# Patient Record
Sex: Male | Born: 1965 | Hispanic: Yes | Smoking: Never smoker
Health system: Southern US, Community
[De-identification: ages and names within clinical notes are randomized; demographics above are authoritative.]

---

## 2014-07-16 ENCOUNTER — Ambulatory Visit (INDEPENDENT_AMBULATORY_CARE_PROVIDER_SITE_OTHER): Payer: Self-pay | Admitting: Family Medicine

## 2014-07-16 VITALS — BP 110/88 | HR 64 | Temp 98.0°F | Resp 16 | Ht 64.0 in | Wt 165.0 lb

## 2014-07-16 DIAGNOSIS — R42 Dizziness and giddiness: Secondary | ICD-10-CM

## 2014-07-16 DIAGNOSIS — R51 Headache: Secondary | ICD-10-CM

## 2014-07-16 DIAGNOSIS — R519 Headache, unspecified: Secondary | ICD-10-CM

## 2014-07-16 DIAGNOSIS — H538 Other visual disturbances: Secondary | ICD-10-CM

## 2014-07-16 LAB — GLUCOSE, POCT (MANUAL RESULT ENTRY): POC Glucose: 82 mg/dl (ref 70–99)

## 2014-07-16 MED ORDER — MECLIZINE HCL 25 MG PO TABS: 25.0000 mg | ORAL_TABLET | Freq: Three times a day (TID) | ORAL | Status: DC | PRN
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
## 2014-07-16 NOTE — Patient Instructions (Signed)
Si el remedio no funciona despues de does diag, favor de regressar y vomos a hacer in CAT scan    Vrtigo (Vertigo)  Vrtigo es la sensacin de que se est moviendo estando quieto. Puede ser peligroso si ocurre cuando est trabajado, conduciendo vehculos o realizando actividades difciles.  CAUSAS  El vrtigo se produce cuando hay un conflicto en las seales que se envan al cerebro desde los sistemas visual y sensorial del cuerpo. Hay numerosas causas que Dole Foodoriginan este problema, entre las que se incluyen:   Infecciones, especialmente en el odo interno.  Nelia ShiUna mala reaccin a un medicamento o mal uso de alcohol y frmacos.  Abstinencia de drogas o alcohol.  Cambios rpidos de posicin, como al [employer], Incacostarse o darse vuelta en la cama.  Dolor de Surveyor, mineralscabeza migraoso.  Disminucin del flujo sanguneo hacia el cerebro.  Aumento de la presin en el cerebro por un traumatismo, infeccin, tumor o sangrado en la cabeza. SNTOMAS  Puede sentir como si el mundo da vueltas o va a caer al piso. Como hay problemas en el equilibrio, el vrtigo puede causar nuseas y vmitos. Tiene movimientos oculares involuntarios (nistagmus).  DIAGNSTICO  El vrtigo normalmente se diagnostica con un examen fsico. Si la causa no se conoce, el mdico puede indicar diagnstico por imgenes, como una resonancia magntica (imgenes por Health visitorresonancia magntica).  TRATAMIENTO  La mayor parte de los casos de vrtigo se resuelve sin TEFL teachertratamiento. Segn la causa, el mdico podr recetar ciertos medicamentos. Si se relaciona con la posicin del cuerpo, podr recomendarle movimientos o procedimientos para corregir el problema. En algunos casos raros, si la causa del vrtigo es un problema en el odo interno, necesitar [place] ciruga.  INSTRUCCIONES PARA EL CUIDADO DOMICILIARIO  Siga las indicaciones del mdico.  Evite conducir vehculos.  Evite operar maquinarias pesadas.  Evite realizar tareas que seran peligrosas para usted u  otras personas durante un episodio de vrtigo.  Comunquele al mdico si nota que ciertos medicamentos parecen asociarse con las crisis. Algunos medicamentos que se usan para tratar los episodios, en Guardian Life Insurancealgunas personas los empeoran. SOLICITE ATENCIN MDICA DE INMEDIATO SI:  Los medicamentos no [place]ivian las crisis o hacen que estas empeoren.  Tiene dificultad para hablar, caminar, siente debilidad o tiene problemas para Boeingusar los brazos, las manos o las piernas.  Comienza a sufrir un dolor de cabeza intenso.  Las nuseas y los vmitos no se Samoaalivian o se Press photographeragravan.  Aparecen trastornos visuales.  Un miembro de su familia nota cambios en su conducta.  Hay alguna modificacin en su trastorno que parece Holiday representativehacerlo empeorar en lugar de Scientist, clinical (histocompatibility and immunogenetics)mejorar. ASEGRESE DE QUE:   Comprende estas instrucciones.  Controlar su enfermedad.  Solicitar ayuda de inmediato si no mejora o si empeora. Document Released: 01/28/2005 Document Revised: 07/13/2011 [facility] Patient Information 2015 [place], [place]. This information is not intended to replace advice given to you by your health care provider. Make sure you discuss any questions you have with your health care provider.

## 2014-09-06 ENCOUNTER — Ambulatory Visit (INDEPENDENT_AMBULATORY_CARE_PROVIDER_SITE_OTHER): Payer: Self-pay | Admitting: Emergency Medicine

## 2014-09-06 VITALS — BP 112/70 | HR 59 | Temp 98.0°F | Resp 18 | Ht 64.0 in | Wt 163.0 lb

## 2014-09-06 DIAGNOSIS — K4090 Unilateral inguinal hernia, without obstruction or gangrene, not specified as recurrent: Secondary | ICD-10-CM

## 2014-09-06 NOTE — Patient Instructions (Signed)
Hernia (Hernia) Una hernia ocurre cuando un rgano interno protruye a travs de un punto dbil de los msculos de la pared muscular abdominal (del vientre). Se producen con mayor frecuencia en la ingle y alrededor del ombligo. Generalmente puede volver a colocarse en su lugar (reducirse). La mayor parte de las hernias tienden a empeorar con el tiempo. Algunas hernias abdominales pueden atascarse en la abertura (hernias irreductibles o hernia encarcelada) y no pueden reducirse. Una hernia abdominal irreducible que est ligeramente apretada en la abertura, corre el riesgo de daar el flujo de sangre (hernia estrangulada). Una hernia estrangulada es una emergencia mdica. Debido al riesgo que se corre en caso de hernia irreducible o estrangulada, se recomienda la ciruga para repararla. CAUSAS  Levantar peso excesivo.  Mucha tos.  Tensin al ir de cuerpo.  Durante la ciruga abdominal se realiza un corte (incisin). INSTRUCCIONES PARA EL CUIDADO DOMICILIARIO  No es necesario hacer reposo en cama. Puede continuar con sus actividades habituales.  Evite levantar peso (ms de 10 libras o 4,5 Kg) o hacer esfuerzos.  Tos con suavidad. Si actualmente usted fuma, es el momento de abandonar el hbito. Hasta el procedimiento quirrgico ms perfecto puede malograrse si se hace fuerza contnua para toser. Aunque su hernia no haya sido reparada, la tos puede agravar el problema.  No use nada apretado sobre la hernia. No trate de mantenerla adentro con un vendaje externo o un braguero. Puede lesionar el contenido abdominal si los aprieta dentro del saco de la hernia.  Consumir una dieta normal.  Evite la constipacin. Si hace mucha fuerza aumentar el tamao de la hernia y podr daarse la reparacin. Si no lo logra slo con la dieta, puede usar laxantes. SOLICITE ATENCIN MDICA DE INMEDIATO SI:  Tiene fiebre.  Presenta un dolor abdominal cada vez ms intenso.  Si tiene malestar estomacal (nuseas) y  vmitos.  La hernia se ha atascado fuera del abdomen, se ve descolorida, se siente dura o le duele.  Observa cambios en el hbito intestinal o en la hernia, lo que no es habitual en usted.  El dolor o la hinchazn alrededor de la hernia aumentan.  No puede volver a colocar la hernia en su lugar ejerciendo una presin suave mientras se encuentra recostado. EST SEGURO QUE:   Comprende las instrucciones para el alta mdica.  Controlar su enfermedad.  Solicitar atencin mdica de inmediato segn las indicaciones. Document Released: 04/20/2005 Document Revised: 07/13/2011 ExitCare Patient Information 2015 ExitCare, LLC. This information is not intended to replace advice given to you by your health care provider. Make sure you discuss any questions you have with your health care provider.  

## 2014-09-06 NOTE — Progress Notes (Signed)
[facility] [address], [place] [id number] [phone]  Date:  09/06/2014   Name:  Darren Day   DOB:  1965/05/19   MRN:  [id number]  PCP:  No PCP Per Patient    Chief Complaint: No chief complaint on file.   History of Present Illness:  Darren Day is a 49 y.o. very pleasant male patient who presents with the following:  Mass over right groin over past month Works in Aeronautical engineerlandscaping. No history of overuse or injury No improvement with over the counter medications or other home remedies.  Denies other complaint or health concern today.   There are no active problems to display for this patient.   No past medical history on file.  No past surgical history on file.  History  Substance Use Topics  . Smoking status: Never Smoker   . Smokeless tobacco: Not on file  . Alcohol Use: No    No family history on file.  No Known Allergies  Medication list has been reviewed and updated.  Current Outpatient Prescriptions on File Prior to Visit  Medication Sig Dispense Refill  . meclizine (ANTIVERT) 25 MG tablet Take 1 tablet (25 mg total) by mouth 3 (three) times daily as needed for dizziness. 30 tablet 0   No current facility-administered medications on file prior to visit.    Review of Systems:  Review of Systems  Constitutional: Negative for fever, chills and fatigue.  HENT: Negative for congestion, ear pain, hearing loss, postnasal drip, rhinorrhea and sinus pressure.   Eyes: Negative for discharge and redness.  Respiratory: Negative for cough, shortness of breath and wheezing.   Cardiovascular: Negative for chest pain and leg swelling.  Gastrointestinal: Negative for nausea, vomiting, abdominal pain, constipation and blood in stool.  Genitourinary: Negative for dysuria, urgency and frequency.  Musculoskeletal: Negative for neck stiffness.  Skin: Negative for rash.  Neurological: Negative for seizures, weakness and headaches.   Physical  Examination: Filed Vitals:   09/06/14 1520  BP: 112/70  Pulse: 59  Temp: 98 F (36.7 C)  Resp: 18   Filed Vitals:   09/06/14 1520  Height: 5\' 4"  (1.626 m)  Weight: 163 lb (73.936 kg)   Body mass index is 27.97 kg/(m^2). Ideal Body Weight: Weight in (lb) to have BMI = 25: 145.3   GEN: WDWN, NAD, Non-toxic, Alert & Oriented x 3 HEENT: Atraumatic, Normocephalic.  Ears and Nose: No external deformity. EXTR: No clubbing/cyanosis/edema NEURO: Normal gait.  PSYCH: Normally interactive. Conversant. Not depressed or anxious appearing.  Calm demeanor.  Right inguinal hernia easily reducible   Assessment and Plan: Right inguinal hernia surgery  Signed Phillips OdorJeffery Emali Heyward, MD

## 2015-05-05 DIAGNOSIS — K4090 Unilateral inguinal hernia, without obstruction or gangrene, not specified as recurrent: Secondary | ICD-10-CM | POA: Insufficient documentation

## 2016-07-31 ENCOUNTER — Emergency Department (HOSPITAL_COMMUNITY)
Admission: EM | Admit: 2016-07-31 | Discharge: 2016-07-31 | Disposition: A | Discharge status: Home / Self Care | Payer: Self-pay | Attending: Emergency Medicine | Admitting: Emergency Medicine

## 2016-07-31 ENCOUNTER — Encounter (HOSPITAL_COMMUNITY): Payer: Self-pay | Admitting: Emergency Medicine

## 2016-07-31 DIAGNOSIS — B029 Zoster without complications: Secondary | ICD-10-CM | POA: Insufficient documentation

## 2016-07-31 LAB — COMPREHENSIVE METABOLIC PANEL
ALT: 56 U/L (ref 17–63)
ANION GAP: 7 (ref 5–15)
AST: 32 U/L (ref 15–41)
Albumin: 4.3 g/dL (ref 3.5–5.0)
Alkaline Phosphatase: 79 U/L (ref 38–126)
BILIRUBIN TOTAL: 1.1 mg/dL (ref 0.3–1.2)
BUN: 14 mg/dL (ref 6–20)
CO2: 27 mmol/L (ref 22–32)
Calcium: 9.8 mg/dL (ref 8.9–10.3)
Chloride: 104 mmol/L (ref 101–111)
Creatinine, Ser: 0.76 mg/dL (ref 0.61–1.24)
GFR calc non Af Amer: >60 mL/min (ref >60)
Glucose, Bld: 141 mg/dL — ABNORMAL HIGH (ref 65–99)
Potassium: 3.5 mmol/L (ref 3.5–5.1)
SODIUM: 138 mmol/L (ref 135–145)
Total Protein: 7.2 g/dL (ref 6.5–8.1)

## 2016-07-31 LAB — CBC WITH DIFFERENTIAL/PLATELET
BASOS ABS: 0.1 10*3/uL (ref 0.0–0.1)
Basophils Relative: 1 %
Eosinophils Absolute: 0.4 10*3/uL (ref 0.0–0.7)
Eosinophils Relative: 6 %
HCT: 44 % (ref 39.0–52.0)
Hemoglobin: 15.1 g/dL (ref 13.0–17.0)
Lymphocytes Relative: 29 %
Lymphs Abs: 1.7 10*3/uL (ref 0.7–4.0)
MCH: 31.5 pg (ref 26.0–34.0)
MCHC: 34.3 g/dL (ref 30.0–36.0)
MCV: 91.7 fL (ref 78.0–100.0)
Monocytes Absolute: 0.5 10*3/uL (ref 0.1–1.0)
Monocytes Relative: 9 %
NEUTROS ABS: 3.3 10*3/uL (ref 1.7–7.7)
NEUTROS PCT: 55 %
PLATELETS: 200 10*3/uL (ref 150–400)
RBC: 4.8 MIL/uL (ref 4.22–5.81)
RDW: 13.4 % (ref 11.5–15.5)
WBC: 5.9 10*3/uL (ref 4.0–10.5)

## 2016-07-31 MED ORDER — NAPROXEN 500 MG PO TABS: 500.0000 mg | ORAL_TABLET | Freq: Two times a day (BID) | ORAL | 0 refills | Status: DC

## 2016-07-31 MED ORDER — HYDROCODONE-ACETAMINOPHEN 5-325 MG PO TABS: 1.0000 | ORAL_TABLET | Freq: Four times a day (QID) | ORAL | 0 refills | Status: DC | PRN

## 2016-07-31 MED ORDER — ACYCLOVIR 400 MG PO TABS: 800.0000 mg | ORAL_TABLET | Freq: Every day | ORAL | 0 refills | Status: AC

## 2016-07-31 NOTE — Discharge Instructions (Signed)
Take medications as prescribed.  Return without fail for worsening symptoms, including fever, worsening rash, rash involving the tip of the nose/forehead/eyes, or any other symptoms concerning to you.

## 2016-07-31 NOTE — ED Provider Notes (Signed)
[facility]EMERGENCY DEPT Provider Note   CSN: [id number] Arrival date & time: 07/31/16  2111     History   Chief Complaint Chief Complaint  Patient presents with  . Herpes Zoster    HPI Darren Day is a 51 y.o. male.  HPI 51 year old male who presents with rash over face. History obtained via spanish interpreter. Onset of the left sided headache about 8 days ago with tingling over the left face. Four days ago developed blistering rash over the left side of the face and the left neck. Painful to touch. No fever or chills. With mild sore throat. No vision changes or severe eye pain. No pus drainage from eye. No ringing in the ears or hearing loss.   History reviewed. No pertinent past medical history.  There are no active problems to display for this patient.   History reviewed. No pertinent surgical history.     Home Medications    Prior to Admission medications   Medication Sig Start Date End Date Taking? Authorizing Provider  meclizine (ANTIVERT) 25 MG tablet Take 1 tablet (25 mg total) by mouth 3 (three) times daily as needed for dizziness. 07/16/14   Elvina Sidle, MD    Family History No family history on file.  Social History Social History  Substance Use Topics  . Smoking status: Never Smoker  . Smokeless tobacco: Never Used  . Alcohol use No     Allergies   Patient has no known allergies.   Review of Systems Review of Systems 10/14 systems reviewed and are negative other than those stated in the HPI   Physical Exam Updated Vital Signs BP (!) 125/91 (BP Location: Right Arm)   Pulse 67   Temp 98.4 F (36.9 C) (Oral)   Resp 16   SpO2 100%   Physical Exam Physical Exam  Nursing note and vitals reviewed. Constitutional: Well developed, well nourished, non-toxic, and in no acute distress Head: Normocephalic and atraumatic.  Mouth/Throat: Oropharynx is clear and moist.  Neck: Normal range of motion. Neck supple. Ears: normal bilateral  TMs Eye: PERRL, no drainage, EOMI, no conjunctival injection.  Cardiovascular: Normal rate and regular rhythm.   Pulmonary/Chest: Effort normal and breath sounds normal.  Abdominal: Soft. There is no tenderness. There is no rebound and no guarding.  Musculoskeletal: Normal range of motion.  Neurological: Alert, no facial droop, fluent speech, moves all extremities symmetrically Skin: Skin is warm and dry.  there is vesicular rash overlying the V3 distribution of the left face and over the left side of the neck.  Psychiatric: Cooperative   ED Treatments / Results  Labs (all labs ordered are listed, but only abnormal results are displayed) Labs Reviewed  COMPREHENSIVE METABOLIC PANEL - Abnormal; Notable for the following:       Result Value   Glucose, Bld 141 (*)    All other components within normal limits  CBC WITH DIFFERENTIAL/PLATELET    EKG  EKG Interpretation None       Radiology No results found.  Procedures Procedures (including critical care time)  Medications Ordered in ED Medications - No data to display   Initial Impression / Assessment and Plan / ED Course  I have reviewed the triage vital signs and the nursing notes.  Pertinent labs & imaging results that were available during my care of the patient were reviewed by me and considered in my medical decision making (see chart for details).     Presentation consistent with herpes zoster, involving the  V3 distribution of the face and the left side of the neck. No V1 distribution involvement/ocular involvement. Will prescribe acyclovir and pain control. Strict return and follow-up instructions reviewed. He expressed understanding of all discharge instructions and felt comfortable with the plan of care.   Final Clinical Impressions(s) / ED Diagnoses   Final diagnoses:  Herpes zoster without complication    New Prescriptions New Prescriptions   No medications on file     Lavera Guise, MD 07/31/16  2313

## 2016-07-31 NOTE — ED Triage Notes (Signed)
Pt presents with L sided face/neck/head and shoulder pain and tingling/numbness x 4-5 days. Fluid-filled rash noted to L clavicle, L side of face, and posterior L ear. Pt denies fevers, no new washes/soaps/detergent. Pain increased with eating. Neuro intact. Denies dizziness. A&O x 4.

## 2017-11-28 ENCOUNTER — Other Ambulatory Visit: Payer: Self-pay

## 2017-11-28 ENCOUNTER — Emergency Department (HOSPITAL_COMMUNITY)
Admission: EM | Admit: 2017-11-28 | Discharge: 2017-11-28 | Disposition: A | Discharge status: Home / Self Care | Payer: Self-pay | Attending: Emergency Medicine | Admitting: Emergency Medicine

## 2017-11-28 ENCOUNTER — Encounter (HOSPITAL_COMMUNITY): Payer: Self-pay | Admitting: Emergency Medicine

## 2017-11-28 ENCOUNTER — Emergency Department (HOSPITAL_COMMUNITY): Payer: Self-pay

## 2017-11-28 DIAGNOSIS — F1010 Alcohol abuse, uncomplicated: Secondary | ICD-10-CM | POA: Insufficient documentation

## 2017-11-28 DIAGNOSIS — R0789 Other chest pain: Secondary | ICD-10-CM | POA: Insufficient documentation

## 2017-11-28 LAB — ETHANOL: Alcohol, Ethyl (B): 297 mg/dL — ABNORMAL HIGH (ref <10)

## 2017-11-28 LAB — BASIC METABOLIC PANEL
ANION GAP: 11 (ref 5–15)
BUN: 7 mg/dL (ref 6–20)
CALCIUM: 9.6 mg/dL (ref 8.9–10.3)
CO2: 27 mmol/L (ref 22–32)
CREATININE: 0.6 mg/dL — AB (ref 0.61–1.24)
Chloride: 103 mmol/L (ref 98–111)
Glucose, Bld: 98 mg/dL (ref 70–99)
Potassium: 3.6 mmol/L (ref 3.5–5.1)
Sodium: 141 mmol/L (ref 135–145)

## 2017-11-28 LAB — CBC
HCT: 45.8 % (ref 39.0–52.0)
HEMOGLOBIN: 15 g/dL (ref 13.0–17.0)
MCH: 31.4 pg (ref 26.0–34.0)
MCHC: 32.8 g/dL (ref 30.0–36.0)
MCV: 96 fL (ref 78.0–100.0)
PLATELETS: 211 10*3/uL (ref 150–400)
RBC: 4.77 MIL/uL (ref 4.22–5.81)
RDW: 12.9 % (ref 11.5–15.5)
WBC: 5.6 10*3/uL (ref 4.0–10.5)

## 2017-11-28 LAB — I-STAT TROPONIN, ED
TROPONIN I, POC: 0 ng/mL (ref 0.00–0.08)
TROPONIN I, POC: 0 ng/mL (ref 0.00–0.08)

## 2017-11-28 MED ORDER — FAMOTIDINE IN NACL 20-0.9 MG/50ML-% IV SOLN
20.0000 mg | Freq: Once | INTRAVENOUS | Status: AC
  Administered 2017-11-28: 20 mg via INTRAVENOUS
  Filled 2017-11-28: qty 50

## 2017-11-28 MED ORDER — SODIUM CHLORIDE 0.9 % IV BOLUS
1000.0000 mL | Freq: Once | INTRAVENOUS | Status: AC
  Administered 2017-11-28: 1000 mL via INTRAVENOUS

## 2017-11-28 NOTE — ED Provider Notes (Signed)
[facility] EMERGENCY DEPARTMENT Provider Note   CSN: [id number] Arrival date & time: 11/28/17  1605     History   Chief Complaint Chief Complaint  Patient presents with  . Chest Pain    HPI Muhammed Yordan Martindale is a 52 y.o. male.  Patient is a 52 year old male with a history of prior alcohol use who presents with epigastric discomfort.  His family is here with him who states that he has been complaining of pain in his throat throughout the day.  He has had a couple episodes of vomiting.  He has been drinking alcohol throughout today.  He has not had any hematemesis.  He complains of burning discomfort in his throat and acid reflux.  His family has stated that he has had some shortness of breath earlier but he is currently denies any shortness of breath.     History reviewed. No pertinent past medical history.  There are no active problems to display for this patient.   History reviewed. No pertinent surgical history.      Home Medications    Prior to Admission medications   Medication Sig Start Date End Date Taking? Authorizing Provider  HYDROcodone-acetaminophen (NORCO/VICODIN) 5-325 MG tablet Take 1-2 tablets by mouth every 6 (six) hours as needed. Patient not taking: Reported on 11/28/2017 07/31/16   Lavera Guise, MD  meclizine (ANTIVERT) 25 MG tablet Take 1 tablet (25 mg total) by mouth 3 (three) times daily as needed for dizziness. Patient not taking: Reported on 11/28/2017 07/16/14   Elvina Sidle, MD  naproxen (NAPROSYN) 500 MG tablet Take 1 tablet (500 mg total) by mouth 2 (two) times daily with a meal. Patient not taking: Reported on 11/28/2017 07/31/16   Lavera Guise, MD    Family History No family history on file.  Social History Social History   Tobacco Use  . Smoking status: Never Smoker  . Smokeless tobacco: Never Used  Substance Use Topics  . Alcohol use: Yes    Alcohol/week: 0.0 oz  . Drug use: No     Allergies   Patient  has no known allergies.   Review of Systems Review of Systems  Constitutional: Negative for chills, diaphoresis, fatigue and fever.  HENT: Negative for congestion, rhinorrhea and sneezing.   Eyes: Negative.   Respiratory: Positive for shortness of breath. Negative for cough and chest tightness.   Cardiovascular: Positive for chest pain. Negative for leg swelling.  Gastrointestinal: Negative for abdominal pain, blood in stool, diarrhea, nausea and vomiting.  Genitourinary: Negative for difficulty urinating, flank pain, frequency and hematuria.  Musculoskeletal: Negative for arthralgias and back pain.  Skin: Negative for rash.  Neurological: Negative for dizziness, speech difficulty, weakness, numbness and headaches.     Physical Exam Updated Vital Signs BP (!) 111/47   Pulse 94   Resp 20   Ht 5\' 6"  (1.676 m)   Wt 72.6 kg (160 lb)   SpO2 96%   BMI 25.82 kg/m   Physical Exam  Constitutional: He is oriented to person, place, and time. He appears well-developed and well-nourished.  HENT:  Head: Normocephalic and atraumatic.  Eyes: Pupils are equal, round, and reactive to light.  Neck: Normal range of motion. Neck supple.  Cardiovascular: Normal rate, regular rhythm and normal heart sounds.  Pulmonary/Chest: Effort normal and breath sounds normal. No respiratory distress. He has no wheezes. He has no rales. He exhibits no tenderness.  Abdominal: Soft. Bowel sounds are normal. There is no tenderness. There  is no rebound and no guarding.  Musculoskeletal: Normal range of motion. He exhibits no edema.  Lymphadenopathy:    He has no cervical adenopathy.  Neurological: He is alert and oriented to person, place, and time.  Skin: Skin is warm and dry. No rash noted.  Psychiatric: He has a normal mood and affect.     ED Treatments / Results  Labs (all labs ordered are listed, but only abnormal results are displayed) Labs Reviewed  BASIC METABOLIC PANEL - Abnormal; Notable for the  following components:      Result Value   Creatinine, Ser 0.60 (*)    All other components within normal limits  ETHANOL - Abnormal; Notable for the following components:   Alcohol, Ethyl (B) 297 (*)    All other components within normal limits  CBC  I-STAT TROPONIN, ED  I-STAT TROPONIN, ED    EKG EKG Interpretation  Date/Time:  Sunday November 28 2017 20:59:05 EDT Ventricular Rate:  72 PR Interval:  156 QRS Duration: 116 QT Interval:  382 QTC Calculation: 418 R Axis:   18 Text Interpretation:  Sinus rhythm Incomplete right bundle branch block ST elev, probable normal early repol pattern similar to EKG from same day Confirmed by Rolan BuccoBelfi, Barbar Brede [id number]) on 11/28/2017 9:05:38 PM   Radiology Dg Chest 2 View  Result Date: 11/28/2017 CLINICAL DATA:  Chest pain EXAM: CHEST - 2 VIEW COMPARISON:  None. FINDINGS: Cardiomegaly. Mild tortuosity of the thoracic aorta. No confluent airspace opacities or effusions. No acute bony abnormality. IMPRESSION: Cardiomegaly.  No active disease. Electronically Signed   By: Charlett NoseKevin  Dover M.D.   On: 11/28/2017 17:12    Procedures Procedures (including critical care time)  Medications Ordered in ED Medications  famotidine (PEPCID) IVPB 20 mg premix (0 mg Intravenous Stopped 11/28/17 1935)  sodium chloride 0.9 % bolus 1,000 mL (0 mLs Intravenous Stopped 11/28/17 2030)     Initial Impression / Assessment and Plan / ED Course  I have reviewed the triage vital signs and the nursing notes.  Pertinent labs & imaging results that were available during my care of the patient were reviewed by me and considered in my medical decision making (see chart for details).     Patient is a 52 year old male who presents intoxicated with burning pain in his throat.  He had no other associated symptoms.  He had a EKG which showed some nonspecific elevation anteriorly which appeared to be more consistent with early repolarization changes.  He has no reciprocal changes.  No  old EKG for comparison.  He had no other reports of chest pain.  He has had 2- troponins.  His pain is completely resolved with Pepcid.  He was monitored in the ED until he became less intoxicated.  He is able to ambulate without ataxia.  He is alert and oriented.  He is discharged home with family.  Final Clinical Impressions(s) / ED Diagnoses   Final diagnoses:  Atypical chest pain  Alcohol abuse    ED Discharge Orders    None       Rolan BuccoBelfi, Raizy Auzenne, MD 11/28/17 2218

## 2017-11-28 NOTE — ED Notes (Signed)
Patient verbalizes understanding of medications and discharge instructions. No further questions at this time. VSS and patient ambulatory at discharge.   Family member also verbalizes understanding of discharge instructions.

## 2017-11-28 NOTE — ED Triage Notes (Signed)
BIB EMS from home, heavy ETOH on board. Pt reports CP and nausea.

## 2017-11-28 NOTE — ED Notes (Signed)
Pt ambulated in hallway with no issues or complaints with a steady gait.

## 2018-04-02 ENCOUNTER — Emergency Department (HOSPITAL_COMMUNITY)
Admission: EM | Admit: 2018-04-02 | Discharge: 2018-04-02 | Disposition: A | Discharge status: Home / Self Care | Payer: Self-pay | Attending: Emergency Medicine | Admitting: Emergency Medicine

## 2018-04-02 ENCOUNTER — Other Ambulatory Visit: Payer: Self-pay

## 2018-04-02 ENCOUNTER — Encounter (HOSPITAL_COMMUNITY): Payer: Self-pay | Admitting: Emergency Medicine

## 2018-04-02 ENCOUNTER — Emergency Department (HOSPITAL_COMMUNITY): Payer: Self-pay

## 2018-04-02 DIAGNOSIS — R066 Hiccough: Secondary | ICD-10-CM | POA: Insufficient documentation

## 2018-04-02 DIAGNOSIS — M542 Cervicalgia: Secondary | ICD-10-CM | POA: Insufficient documentation

## 2018-04-02 DIAGNOSIS — R112 Nausea with vomiting, unspecified: Secondary | ICD-10-CM | POA: Insufficient documentation

## 2018-04-02 DIAGNOSIS — R079 Chest pain, unspecified: Secondary | ICD-10-CM | POA: Insufficient documentation

## 2018-04-02 LAB — COMPREHENSIVE METABOLIC PANEL
ALT: 78 U/L — ABNORMAL HIGH (ref 0–44)
AST: 40 U/L (ref 15–41)
Albumin: 4.4 g/dL (ref 3.5–5.0)
Alkaline Phosphatase: 84 U/L (ref 38–126)
Anion gap: 12 (ref 5–15)
BUN: 9 mg/dL (ref 6–20)
CALCIUM: 9.5 mg/dL (ref 8.9–10.3)
CHLORIDE: 99 mmol/L (ref 98–111)
CO2: 27 mmol/L (ref 22–32)
CREATININE: 0.67 mg/dL (ref 0.61–1.24)
Glucose, Bld: 102 mg/dL — ABNORMAL HIGH (ref 70–99)
POTASSIUM: 3.6 mmol/L (ref 3.5–5.1)
SODIUM: 138 mmol/L (ref 135–145)
TOTAL PROTEIN: 8.1 g/dL (ref 6.5–8.1)
Total Bilirubin: 0.7 mg/dL (ref 0.3–1.2)

## 2018-04-02 LAB — CBC WITH DIFFERENTIAL/PLATELET
Abs Immature Granulocytes: 0.02 10*3/uL (ref 0.00–0.07)
BASOS PCT: 1 %
Basophils Absolute: 0.1 10*3/uL (ref 0.0–0.1)
EOS ABS: 0.1 10*3/uL (ref 0.0–0.5)
Eosinophils Relative: 1 %
HCT: 47.2 % (ref 39.0–52.0)
Hemoglobin: 15.6 g/dL (ref 13.0–17.0)
IMMATURE GRANULOCYTES: 0 %
Lymphocytes Relative: 19 %
Lymphs Abs: 1.5 10*3/uL (ref 0.7–4.0)
MCH: 31 pg (ref 26.0–34.0)
MCHC: 33.1 g/dL (ref 30.0–36.0)
MCV: 93.7 fL (ref 80.0–100.0)
MONOS PCT: 5 %
Monocytes Absolute: 0.4 10*3/uL (ref 0.1–1.0)
NEUTROS PCT: 74 %
Neutro Abs: 5.9 10*3/uL (ref 1.7–7.7)
PLATELETS: 227 10*3/uL (ref 150–400)
RBC: 5.04 MIL/uL (ref 4.22–5.81)
RDW: 12.7 % (ref 11.5–15.5)
WBC: 7.9 10*3/uL (ref 4.0–10.5)
nRBC: 0 % (ref 0.0–0.2)

## 2018-04-02 LAB — LIPASE, BLOOD: LIPASE: 31 U/L (ref 11–51)

## 2018-04-02 MED ORDER — ALUM & MAG HYDROXIDE-SIMETH 200-200-20 MG/5ML PO SUSP
30.0000 mL | Freq: Once | ORAL | Status: AC
  Administered 2018-04-02: 30 mL via ORAL
  Filled 2018-04-02: qty 30

## 2018-04-02 MED ORDER — LIDOCAINE VISCOUS HCL 2 % MT SOLN
15.0000 mL | Freq: Once | OROMUCOSAL | Status: AC
  Administered 2018-04-02: 15 mL via ORAL
  Filled 2018-04-02: qty 15

## 2018-04-02 MED ORDER — ONDANSETRON 4 MG PO TBDP
4.0000 mg | ORAL_TABLET | Freq: Once | ORAL | Status: AC
  Administered 2018-04-02: 4 mg via ORAL
  Filled 2018-04-02: qty 1

## 2018-04-02 MED ORDER — OMEPRAZOLE 20 MG PO CPDR: 20.0000 mg | DELAYED_RELEASE_CAPSULE | Freq: Every day | ORAL | 0 refills | Status: AC

## 2018-04-02 MED ORDER — FAMOTIDINE 20 MG PO TABS: 20.0000 mg | ORAL_TABLET | Freq: Two times a day (BID) | ORAL | 0 refills | Status: DC

## 2018-04-02 NOTE — ED Notes (Signed)
Patient verbalizes understanding of discharge instructions. Opportunity for questioning and answers were provided. Armband removed by staff, pt discharged from ED.  

## 2018-04-02 NOTE — ED Triage Notes (Signed)
Pt arrives via EMS with sore throat, hiccups and vomiting x5-7 hours. Reports ETOH on board.

## 2018-04-02 NOTE — ED Provider Notes (Signed)
[facility] EMERGENCY DEPARTMENT Provider Note   CSN: [id number] Arrival date & time: 04/02/18  1700     History   Chief Complaint Chief Complaint  Patient presents with  . Emesis  . Hiccups  . Sore Throat    HPI Darren Day is a 52 y.o. male.  Patient presents the emergency department by EMS with complaint of vomiting, chest burning, and neck pain.  Patient was at home today drinking alcohol.  He admits to drinking 6-7 beers.  Patient began to have hiccuping, pain in the chest described as burning, burning in his throat as well.  Reports one episode of vomiting about 5 hours ago.  No abdominal pain.  No back pain.  No diaphoresis.  No symptoms with exertion.  Per EMS, blood sugar was normal range.  Patient denies any medical problems including cardiac history.  The onset of this condition was acute. The course is constant. Aggravating factors: none. Alleviating factors: none.       History reviewed. No pertinent past medical history.  There are no active problems to display for this patient.   History reviewed. No pertinent surgical history.      Home Medications    Prior to Admission medications   Medication Sig Start Date End Date Taking? Authorizing Provider  HYDROcodone-acetaminophen (NORCO/VICODIN) 5-325 MG tablet Take 1-2 tablets by mouth every 6 (six) hours as needed. Patient not taking: Reported on 11/28/2017 07/31/16   Lavera Guise, MD  meclizine (ANTIVERT) 25 MG tablet Take 1 tablet (25 mg total) by mouth 3 (three) times daily as needed for dizziness. Patient not taking: Reported on 11/28/2017 07/16/14   Elvina Sidle, MD  naproxen (NAPROSYN) 500 MG tablet Take 1 tablet (500 mg total) by mouth 2 (two) times daily with a meal. Patient not taking: Reported on 11/28/2017 07/31/16   Lavera Guise, MD    Family History History reviewed. No pertinent family history.  Social History Social History   Tobacco Use  . Smoking status:  Never Smoker  . Smokeless tobacco: Never Used  Substance Use Topics  . Alcohol use: Yes    Alcohol/week: 6.0 - 7.0 standard drinks    Types: 6 - 7 Cans of beer per week  . Drug use: No     Allergies   Patient has no known allergies.   Review of Systems Review of Systems  Constitutional: Negative for diaphoresis and fever.  Eyes: Negative for redness.  Respiratory: Negative for cough and shortness of breath.   Cardiovascular: Positive for chest pain. Negative for palpitations and leg swelling.  Gastrointestinal: Positive for nausea and vomiting. Negative for abdominal pain.  Genitourinary: Negative for dysuria.  Musculoskeletal: Positive for neck pain. Negative for back pain.  Skin: Negative for rash.  Neurological: Negative for syncope and light-headedness.  Psychiatric/Behavioral: The patient is not nervous/anxious.      Physical Exam Updated Vital Signs BP (!) 127/107 (BP Location: Right Arm)   Pulse 90   Temp 98.3 F (36.8 C) (Oral)   Resp 16   Wt 70.3 kg   SpO2 97%   BMI 25.02 kg/m   Physical Exam  Constitutional: He appears well-developed and well-nourished.  HENT:  Head: Normocephalic and atraumatic.  Mouth/Throat: Mucous membranes are normal. Mucous membranes are not dry.  Eyes: Conjunctivae are normal.  Neck: Trachea normal and normal range of motion. Neck supple. Normal carotid pulses and no JVD present. No muscular tenderness present. Carotid bruit is not present. No tracheal  deviation present.  Cardiovascular: Normal rate, regular rhythm, S1 normal, S2 normal, normal heart sounds and intact distal pulses. Exam reveals no distant heart sounds and no decreased pulses.  No murmur heard. Pulmonary/Chest: Effort normal and breath sounds normal. No respiratory distress. He has no wheezes. He exhibits no tenderness.  Abdominal: Soft. Normal aorta and bowel sounds are normal. There is no tenderness. There is no rebound and no guarding.  Musculoskeletal: He  exhibits no edema.  Neurological: He is alert.  Skin: Skin is warm and dry. He is not diaphoretic. No cyanosis. No pallor.  Psychiatric: He has a normal mood and affect.  Nursing note and vitals reviewed.    ED Treatments / Results  Labs (all labs ordered are listed, but only abnormal results are displayed) Labs Reviewed  COMPREHENSIVE METABOLIC PANEL - Abnormal; Notable for the following components:      Result Value   Glucose, Bld 102 (*)    ALT 78 (*)    All other components within normal limits  CBC WITH DIFFERENTIAL/PLATELET  LIPASE, BLOOD    EKG EKG Interpretation  Date/Time:  Saturday April 02 2018 17:33:07 EST Ventricular Rate:  87 PR Interval:  146 QRS Duration: 112 QT Interval:  374 QTC Calculation: 450 R Axis:   15 Text Interpretation:  Normal sinus rhythm Normal ECG No significant change since last tracing Confirmed by Marily MemosMesner, Jason [phone]) on 04/02/2018 5:44:13 PM   Radiology Dg Chest 2 View  Result Date: 04/02/2018 CLINICAL DATA:  Chest pain EXAM: CHEST - 2 VIEW COMPARISON:  11/28/2017 FINDINGS: The heart size and mediastinal contours are within normal limits. Both lungs are clear. The visualized skeletal structures are unremarkable. IMPRESSION: No active cardiopulmonary disease. Electronically Signed   By: Marlan Palauharles  Clark M.D.   On: 04/02/2018 18:13    Procedures Procedures (including critical care time)  Medications Ordered in ED Medications  alum & mag hydroxide-simeth (MAALOX/MYLANTA) 200-200-20 MG/5ML suspension 30 mL (30 mLs Oral Given 04/02/18 1743)    And  lidocaine (XYLOCAINE) 2 % viscous mouth solution 15 mL (15 mLs Oral Given 04/02/18 1743)  ondansetron (ZOFRAN-ODT) disintegrating tablet 4 mg (4 mg Oral Given 04/02/18 1744)     Initial Impression / Assessment and Plan / ED Course  I have reviewed the triage vital signs and the nursing notes.  Pertinent labs & imaging results that were available during my care of the patient were  reviewed by me and considered in my medical decision making (see chart for details).     Patient seen and examined.  History taken using interpreter.  Work-up initiated. Medications ordered.  Low concern for cardiac etiology, but will check 12-lead and chest x-ray.  Will treat patient's symptoms.  Vital signs reviewed and are as follows: BP (!) 127/107 (BP Location: Right Arm)   Pulse 90   Temp 98.3 F (36.8 C) (Oral)   Resp 16   Wt 70.3 kg   SpO2 97%   BMI 25.02 kg/m   ED ECG REPORT   Date: 04/02/2018  Rate: 87  Rhythm: normal sinus rhythm  QRS Axis: normal  Intervals: normal  ST/T Wave abnormalities: normal  Conduction Disutrbances:none  Narrative Interpretation:   Old EKG Reviewed: unchanged from 11/29/17  I have personally reviewed the EKG tracing and agree with the computerized printout as noted.  7:06 PM patient reevaluated.  Interpreter utilized.  Exam unchanged.  He states that he is feeling much better.  Symptoms are resolved after GI cocktail.  Discussed avoidance of alcohol  and likely diagnosis of gastritis with patient.  Will discharge home on PPI and H2 blocker.  Discussed that if patient's chest pain recurs, if he develops shortness of breath, persistent vomiting, or other symptoms he should return to the emergency department.  He verbalized understanding agrees with plan.   Final Clinical Impressions(s) / ED Diagnoses   Final diagnoses:  Chest pain, unspecified type   Patient with a burning chest pain.  Normal EKG and chest x-ray.  I have low concern for ACS, PE, dissection at this time.  Patient's symptoms are in the setting of alcohol use.  Suggest a degree of gastritis/esophagitis.  Patient will be treated as such.  Return instructions as above.  PCP referral given.   ED Discharge Orders         Ordered    omeprazole (PRILOSEC) 20 MG capsule  Daily     04/02/18 1905    famotidine (PEPCID) 20 MG tablet  2 times daily     04/02/18 1905             Renne Crigler, Cordelia Poche 04/02/18 1909    Mesner, Barbara Cower, MD 04/02/18 2003

## 2018-04-02 NOTE — Discharge Instructions (Signed)
Please read and follow all provided instructions.  Your diagnoses today include:  1. Chest pain, unspecified type     Tests performed today include:  An EKG of your heart  A chest x-ray  Blood counts and electrolytes  Vital signs. See below for your results today.   Medications prescribed:   Omeprazole (Prilosec) - stomach acid reducer  This medication can be found over-the-counter   Pepcid (famotidine) - antihistamine  You can find this medication over-the-counter.   DO NOT exceed:   20mg  Pepcid every 12 hours  Take any prescribed medications only as directed.  Follow-up instructions: Please follow-up with your primary care provider in the next week for a recheck.   Return instructions:  SEEK IMMEDIATE MEDICAL ATTENTION IF:  You have severe chest pain, especially if the pain is crushing or pressure-like and spreads to the arms, back, neck, or jaw, or if you have sweating, nausea (feeling sick to your stomach), or shortness of breath. THIS IS AN EMERGENCY. Don't wait to see if the pain will go away. Get medical help at once. Call 911 or 0 (operator). DO NOT drive yourself to the hospital.   Your chest pain gets worse and does not go away with rest.   You have an attack of chest pain lasting longer than usual, despite rest and treatment with the medications your caregiver has prescribed.   You wake from sleep with chest pain or shortness of breath.  You feel dizzy or faint.  You have chest pain not typical of your usual pain for which you originally saw your caregiver.   You have any other emergent concerns regarding your health.  Additional Information: Chest pain comes from many different causes. Your caregiver has diagnosed you as having chest pain that is not specific for one problem, but does not require admission.  You are at low risk for an acute heart condition or other serious illness.   Your vital signs today were: BP (!) 127/107 (BP Location: Right  Arm)    Pulse 90    Temp 98.3 F (36.8 C) (Oral)    Resp 16    Wt 70.3 kg    SpO2 97%    BMI 25.02 kg/m  If your blood pressure (BP) was elevated above 135/85 this visit, please have this repeated by your doctor within one month. --------------

## 2019-06-25 ENCOUNTER — Encounter (HOSPITAL_COMMUNITY): Payer: Self-pay | Admitting: Emergency Medicine

## 2019-06-25 ENCOUNTER — Other Ambulatory Visit: Payer: Self-pay

## 2019-06-25 ENCOUNTER — Emergency Department (HOSPITAL_COMMUNITY): Payer: Self-pay

## 2019-06-25 ENCOUNTER — Emergency Department (HOSPITAL_COMMUNITY)
Admission: EM | Admit: 2019-06-25 | Discharge: 2019-06-25 | Disposition: A | Discharge status: Home / Self Care | Payer: Self-pay | Attending: Emergency Medicine | Admitting: Emergency Medicine

## 2019-06-25 DIAGNOSIS — J029 Acute pharyngitis, unspecified: Secondary | ICD-10-CM | POA: Insufficient documentation

## 2019-06-25 DIAGNOSIS — Z79899 Other long term (current) drug therapy: Secondary | ICD-10-CM | POA: Insufficient documentation

## 2019-06-25 MED ORDER — SUCRALFATE 1 GM/10ML PO SUSP: 1.0000 g | Freq: Three times a day (TID) | ORAL | 0 refills | Status: AC

## 2019-06-25 MED ORDER — FAMOTIDINE 20 MG PO TABS: 20.0000 mg | ORAL_TABLET | Freq: Two times a day (BID) | ORAL | 0 refills | Status: AC

## 2019-06-25 MED ORDER — ALUM & MAG HYDROXIDE-SIMETH 200-200-20 MG/5ML PO SUSP
30.0000 mL | Freq: Once | ORAL | Status: AC
Start: 2019-06-25 — End: 2019-06-25
  Administered 2019-06-25: 30 mL via ORAL
  Filled 2019-06-25: qty 30

## 2019-06-25 MED ORDER — LIDOCAINE VISCOUS HCL 2 % MT SOLN
15.0000 mL | Freq: Once | OROMUCOSAL | Status: AC
  Administered 2019-06-25: 17:00:00 15 mL via ORAL
  Filled 2019-06-25: qty 15

## 2019-06-25 NOTE — ED Triage Notes (Signed)
Patient brought in by [facility] from home, while using medical interpreter patient c/o burning sensation in throat x2 hours. EMS reports patient choked on chicken PTA but patient denies any choking events, only c/o burning.

## 2019-06-25 NOTE — ED Provider Notes (Signed)
[facility] EMERGENCY DEPARTMENT Provider Note   CSN: [id number] Arrival date & time: 06/25/19  1548     History Chief Complaint  Patient presents with  . Sore Throat    Darren Day is a 54 y.o. male with past medical history who presents for evaluation of neck burning.  Patient states he has had a burning sensation to his throat x2 hours.  States he was eating at home and about 20 minutes into eating he developed the sensation.  He did take a Pepcid however this did not resolve his pain.  He denies any coughing or choking episodes.  He has been able to tolerate oral medications without any difficulty.  EMS reports that patient possibly choked however he denied this on triage as well as repeat discussions with nursing and myself.  He denies fever, chills, nausea, vomiting, chest pain, shortness of breath abdominal pain, diarrhea, dysuria, drooling, dysphagia, trismus.  Denies additional aggravating or alleviating factors.  No prior history of cardiac problems.  He denies any exertional chest pain at home or pleuritic chest pain.  Nuys any nausea here in the ED.  History obtained from patient and past medical records.  Medical Spanish interpreter was used.  HPI     History reviewed. No pertinent past medical history.  There are no problems to display for this patient.   History reviewed. No pertinent surgical history.     No family history on file.  Social History   Tobacco Use  . Smoking status: Never Smoker  . Smokeless tobacco: Never Used  Substance Use Topics  . Alcohol use: Yes    Alcohol/week: 6.0 - 7.0 standard drinks    Types: 6 - 7 Cans of beer per week  . Drug use: No    Home Medications Prior to Admission medications   Medication Sig Start Date End Date Taking? Authorizing Provider  famotidine (PEPCID) 20 MG tablet Take 1 tablet (20 mg total) by mouth 2 (two) times daily. 06/25/19   Norelle Runnion A, PA-C  omeprazole (PRILOSEC) 20  MG capsule Take 1 capsule (20 mg total) by mouth daily. 04/02/18   Renne Crigler, PA-C  sucralfate (CARAFATE) 1 GM/10ML suspension Take 10 mLs (1 g total) by mouth 4 (four) times daily -  with meals and at bedtime. 06/25/19   Norabelle Kondo A, PA-C    Allergies    Patient has no known allergies.  Review of Systems   Review of Systems  Constitutional: Negative.   HENT: Positive for sore throat. Negative for congestion, dental problem, drooling, ear discharge, ear pain, facial swelling, nosebleeds, postnasal drip, rhinorrhea, sinus pressure, sinus pain, sneezing, tinnitus, trouble swallowing and voice change.   Respiratory: Negative.   Cardiovascular: Negative.   Gastrointestinal: Negative.   Genitourinary: Negative.   Musculoskeletal: Negative.   Skin: Negative.   Neurological: Negative.   Hematological: Negative.   All other systems reviewed and are negative.   Physical Exam Updated Vital Signs BP 110/79 (BP Location: Left Arm)   Pulse 84   Temp 98.1 F (36.7 C) (Oral)   Resp 14   Ht 5' 4.96" (1.65 m)   Wt 70.3 kg   SpO2 97%   BMI 25.82 kg/m   Physical Exam Vitals and nursing note reviewed.  Constitutional:      General: He is not in acute distress.    Appearance: He is well-developed. He is not ill-appearing, toxic-appearing or diaphoretic.  HENT:     Head: Normocephalic and atraumatic.  Jaw: There is normal jaw occlusion.     Nose: No congestion or rhinorrhea.     Mouth/Throat:     Lips: Pink.     Mouth: Mucous membranes are moist.     Pharynx: Oropharynx is clear. Uvula midline.     Comments: Posterior oropharynx clear.  Mucous membranes moist.  Uvula midline without deviation.  No evidence of tonsillar edema or exudate.  No drooling, dysphagia or trismus.  Tolerating secretions without difficulty. Eyes:     Pupils: Pupils are equal, round, and reactive to light.  Neck:     Trachea: Trachea and phonation normal.     Comments: No neck stiffness or neck  rigidity.  No phonation changes Cardiovascular:     Rate and Rhythm: Normal rate and regular rhythm.     Pulses: Normal pulses.     Heart sounds: Normal heart sounds.  Pulmonary:     Effort: Pulmonary effort is normal. No respiratory distress.     Breath sounds: Normal breath sounds and air entry.  Chest:     Chest wall: No mass, deformity, swelling, tenderness or crepitus.  Abdominal:     General: There is no distension.     Palpations: Abdomen is soft.  Musculoskeletal:        General: Normal range of motion.     Cervical back: Full passive range of motion without pain, normal range of motion and neck supple.  Skin:    General: Skin is warm and dry.     Capillary Refill: Capillary refill takes less than 2 seconds.     Comments: Brisk capillary refill.  No edema, erythema or warmth  Neurological:     Mental Status: He is alert.     ED Results / Procedures / Treatments   Labs (all labs ordered are listed, but only abnormal results are displayed) Labs Reviewed - No data to display  EKG EKG Interpretation  Date/Time:  Sunday June 25 2019 17:27:49 EST Ventricular Rate:  91 PR Interval:    QRS Duration: 117 QT Interval:  360 QTC Calculation: 443 R Axis:   -41 Text Interpretation: Sinus rhythm Nonspecific IVCD with LAD Left ventricular hypertrophy ST elev, probable normal early repol pattern No significant change since last tracing Confirmed by Fredia Sorrow [phone] on 06/25/2019 5:50:51 PM   Radiology DG Neck Soft Tissue  Result Date: 06/25/2019 CLINICAL DATA:  54 year old male with burning sensation in throat. EXAM: NECK SOFT TISSUES - 1+ VIEW COMPARISON:  Chest radiograph dated 06/25/2019. FINDINGS: There is calcification of the posterior larynx likely representing calcification of the thyroid cartilage. There is however slight bowing of the soft tissues posteriorly. If there is clinical concern for aspiration further evaluation with CT is recommended. The osseous  structures and soft tissues are otherwise unremarkable. The visualized upper airway is patent. IMPRESSION: Probable calcification of the posterior thyroid cartilage. CT may provide better evaluation. Electronically Signed   By: Anner Crete M.D.   On: 06/25/2019 18:00   DG Chest 2 View  Result Date: 06/25/2019 CLINICAL DATA:  Chest pain EXAM: CHEST - 2 VIEW COMPARISON:  04/02/2018 FINDINGS: The heart size and mediastinal contours are within normal limits. Both lungs are clear. The visualized skeletal structures are unremarkable. IMPRESSION: No active cardiopulmonary disease. Electronically Signed   By: Ulyses Jarred M.D.   On: 06/25/2019 17:51    Procedures Procedures (including critical care time)  Medications Ordered in ED Medications  alum & mag hydroxide-simeth (MAALOX/MYLANTA) 200-200-20 MG/5ML suspension 30 mL (30  mLs Oral Given 06/25/19 1711)    And  lidocaine (XYLOCAINE) 2 % viscous mouth solution 15 mL (15 mLs Oral Given 06/25/19 1711)    ED Course  I have reviewed the triage vital signs and the nursing notes.  Pertinent labs & imaging results that were available during my care of the patient were reviewed by me and considered in my medical decision making (see chart for details).  54 year old male appears otherwise well presents for evaluation of sensation of throat burning.  This began while he was eating dinner.  He denies any chest pain, shortness of breath.  He denies any choking episodes, coughing, drooling, dysphagia or trismus.  His posterior oropharynx is clear.  His mucous membranes are moist.  He is tolerating his secretions.  No phonation changes.  No neck stiffness or neck rigidity.  No overlying skin changes.  Patient did take a Pepcid at home was able to tolerate this.  Patient denies eating anything with bones or sensation of something being stuck in his throat.  Plan on GI cocktail, some plain films.  Denies any chest pain, shortness of breath or any cardiac or  pulmonary related symptoms.  Have low suspicion for atypical ACS, PE, dissection.  Clinical Course as of Jun 24 1816  Wynelle Link Jun 25, 2019  1802 No STEMI, EKG similar to prior  EKG 12-Lead [BH]  1802 No cardiomegaly, infiltrates, pneumothorax.  No evidence of foreign body  DG Chest 2 View [BH]    Clinical Course User Index [BH] Cosandra Plouffe A, PA-C  Able to tolerate GI cocktail, drinking crackers without any difficulty.  Pain with significant improvement with GI cocktail.  Does have some mild residual pain however patient states this is significantly improved.  Pain does not sound like atypical ACS, PE or dissection.  Low suspicion for food bolus given patient is able to tolerate p.o. solid and liquid food intake.  No evidence of bacterial infectious process on exam.  No evidence of PTA or RPA.  Normal lung sounds, no stridor, phonation changes.  Low suspicion for edema or allergic reaction.  Will start him on Carafate, Pepcid.  Discussed return precautions.  Patient voiced understanding and is agreeable for follow-up.  The patient has been appropriately medically screened and/or stabilized in the ED. I have low suspicion for any other emergent medical condition which would require further screening, evaluation or treatment in the ED or require inpatient management.  Patient is hemodynamically stable and in no acute distress.  Patient able to ambulate in department prior to ED.  Evaluation does not show acute pathology that would require ongoing or additional emergent interventions while in the emergency department or further inpatient treatment.  I have discussed the diagnosis with the patient and answered all questions.  Pain is been managed while in the emergency department and patient has no further complaints prior to discharge.  Patient is comfortable with plan discussed in room and is stable for discharge at this time.  I have discussed strict return precautions for returning to the emergency  department.  Patient was encouraged to follow-up with PCP/specialist refer to at discharge.    MDM Rules/Calculators/A&P                       Final Clinical Impression(s) / ED Diagnoses Final diagnoses:  Sore throat    Rx / DC Orders ED Discharge Orders         Ordered    sucralfate (CARAFATE) 1 GM/10ML  suspension  3 times daily with meals & bedtime     06/25/19 1808    famotidine (PEPCID) 20 MG tablet  2 times daily     06/25/19 1808           Strider Vallance A, PA-C 06/25/19 1818    Vanetta Mulders, MD 06/27/19 1541

## 2019-06-25 NOTE — Discharge Instructions (Signed)
Take the medications as prescribed  Return for new or worsening symptoms 

## 2019-06-25 NOTE — ED Notes (Signed)
Patient transported to X-ray 

## 2019-06-25 NOTE — ED Notes (Signed)
Pt transported to Xray. 

## 2021-08-05 IMAGING — CR DG NECK SOFT TISSUE
2 series · 2 of 2 positions shown · non-contrast
Comparison: Chest radiograph dated 06/25/2019.

CLINICAL DATA: 53-year-old male with burning sensation in throat.

EXAM:
NECK SOFT TISSUES - 1+ VIEW

[neck lat]
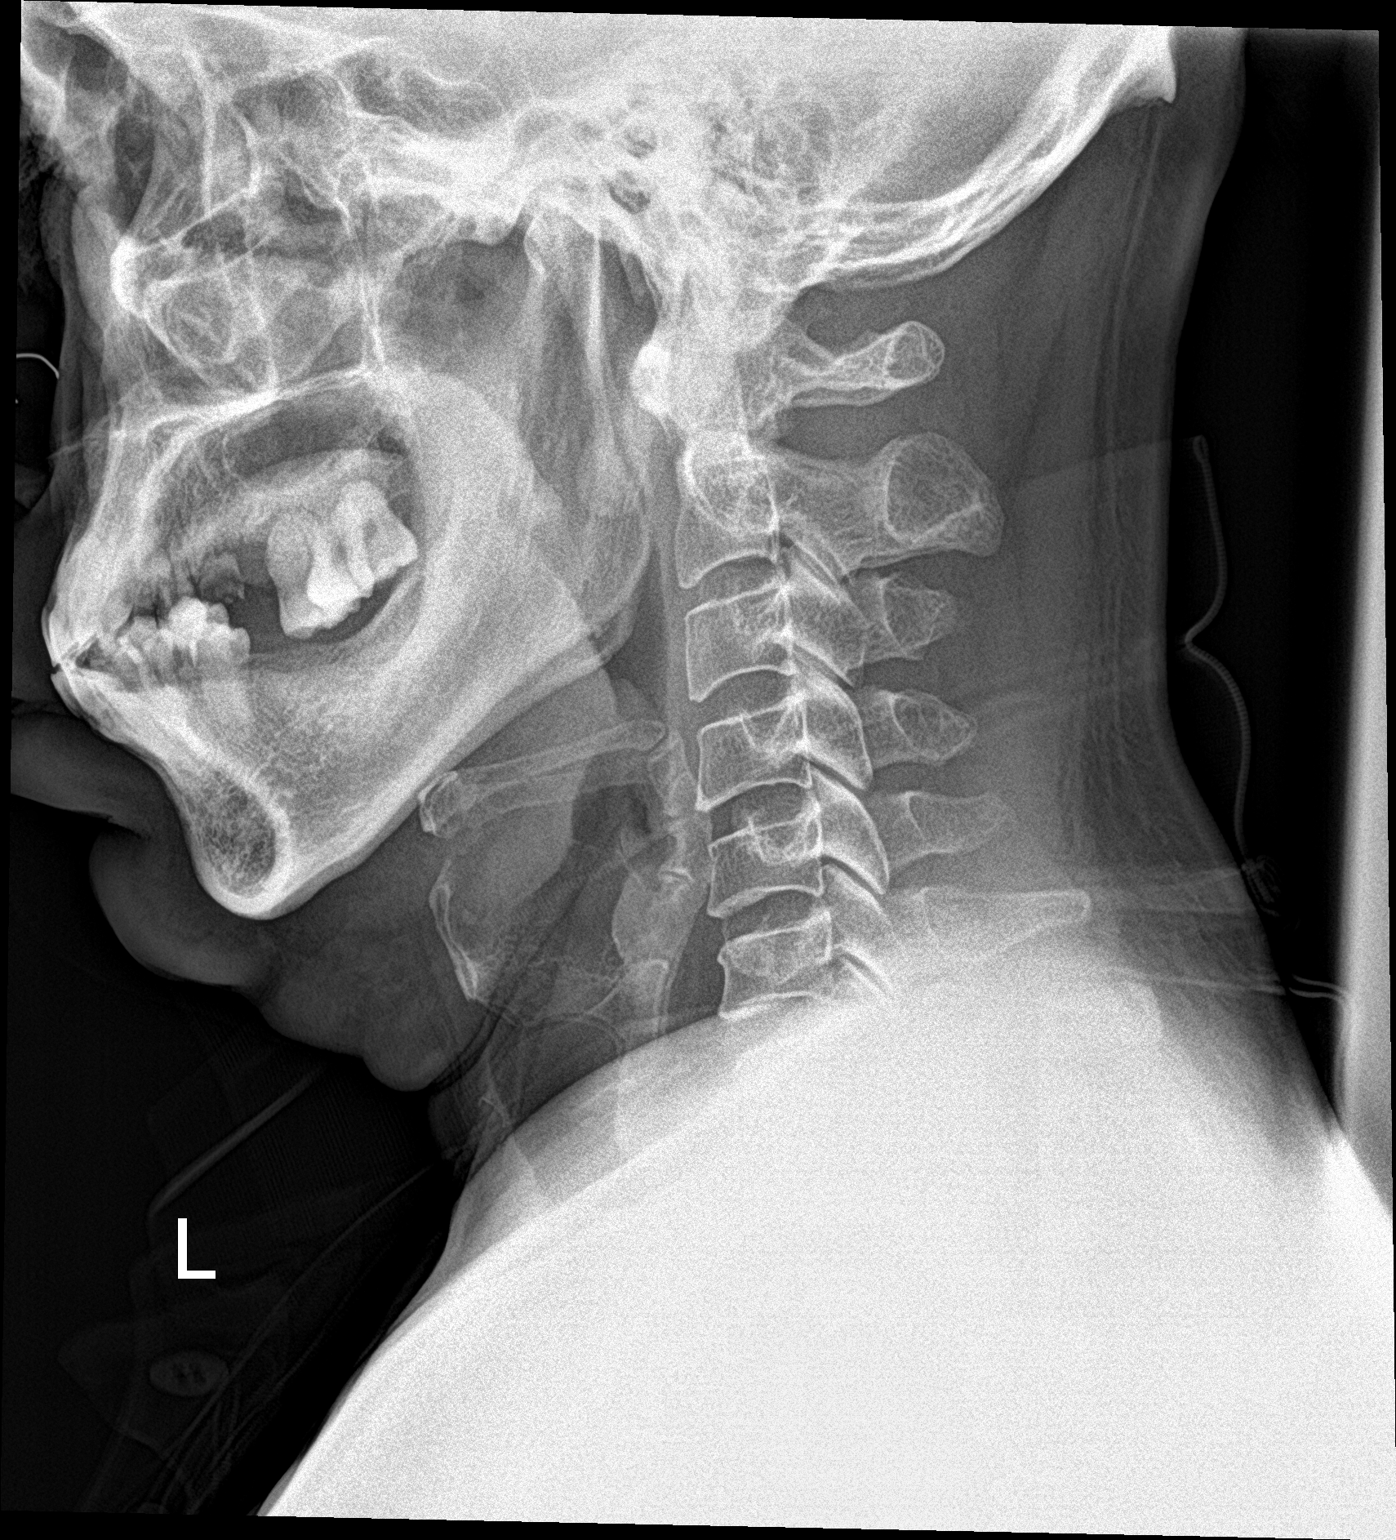

[neck ap]
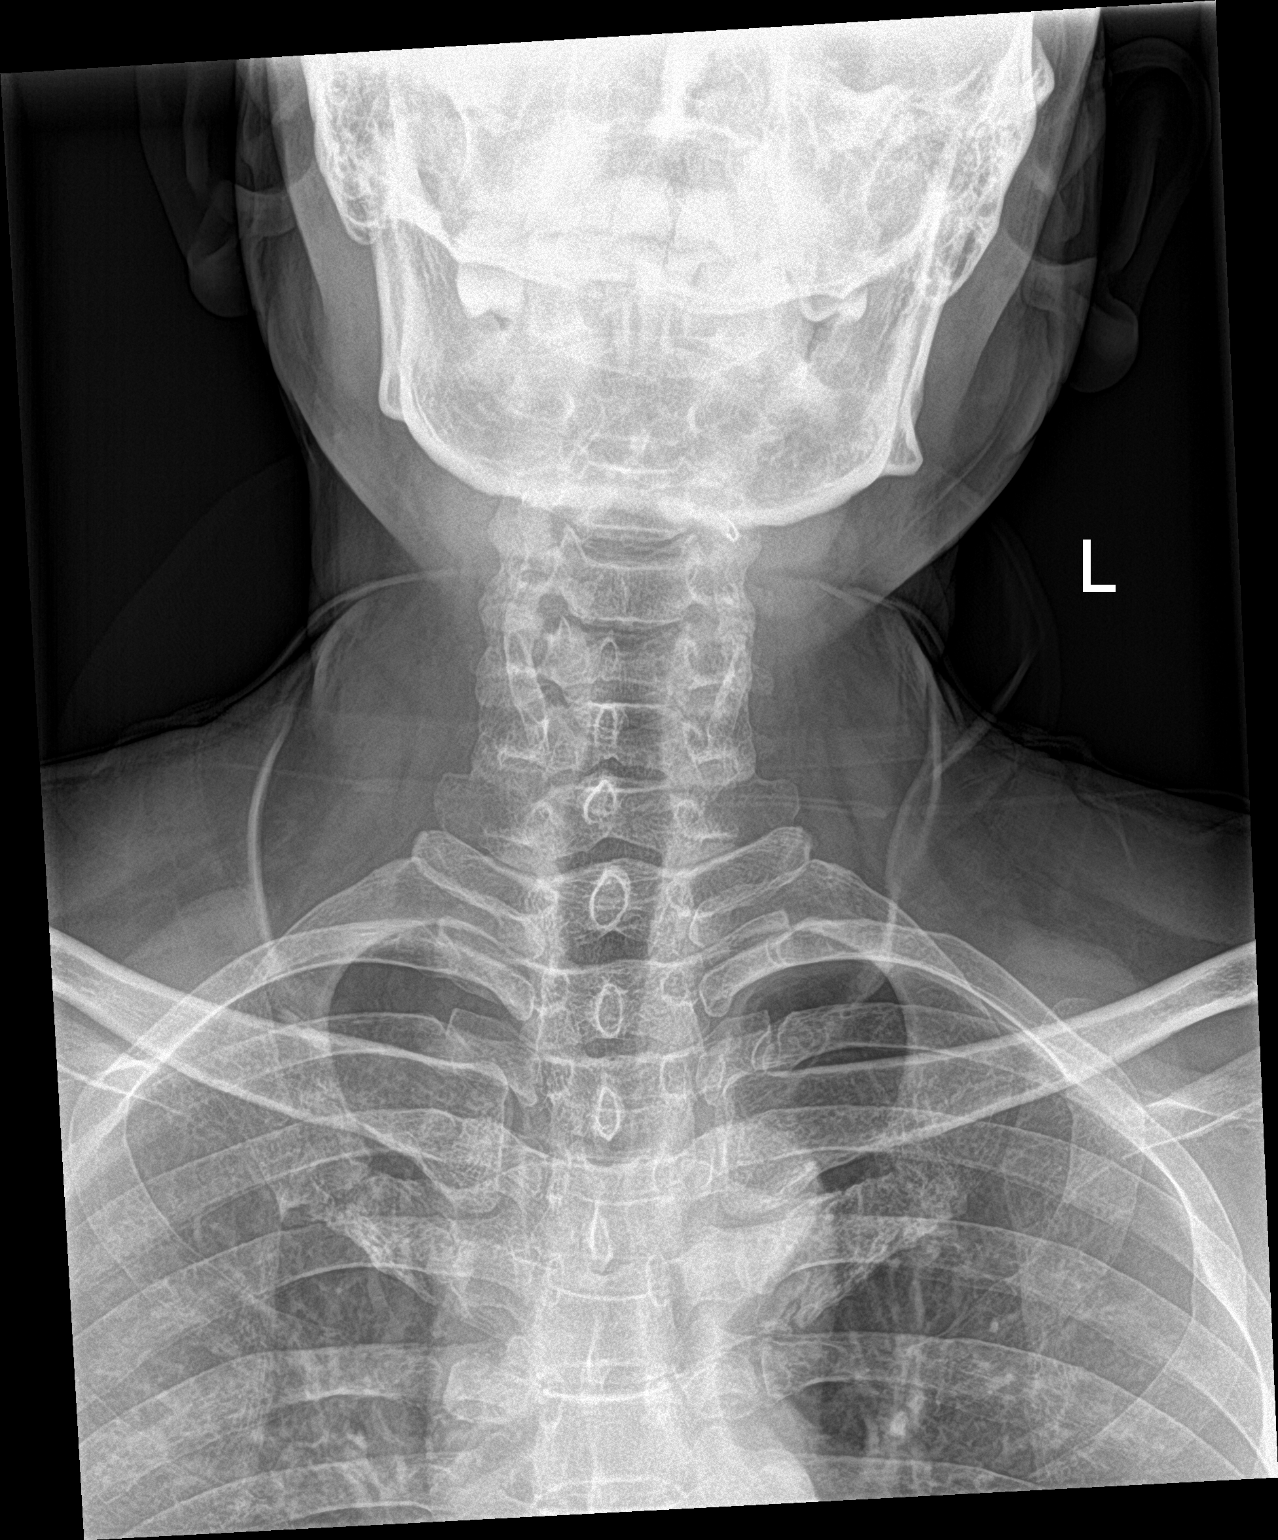

[2 of 2 positions shown; findings below may reference images not displayed]

FINDINGS: There is calcification of the posterior larynx likely representing
calcification of the thyroid cartilage. There is however slight
bowing of the soft tissues posteriorly. If there is clinical concern
for aspiration further evaluation with CT is recommended. The
osseous structures and soft tissues are otherwise unremarkable. The
visualized upper airway is patent.
IMPRESSION: Probable calcification of the posterior thyroid cartilage. CT may
provide better evaluation.

## 2023-01-31 ENCOUNTER — Encounter (HOSPITAL_COMMUNITY): Payer: Self-pay | Admitting: Emergency Medicine

## 2023-01-31 ENCOUNTER — Emergency Department (HOSPITAL_COMMUNITY): Payer: Self-pay

## 2023-01-31 ENCOUNTER — Emergency Department (HOSPITAL_COMMUNITY)
Admission: EM | Admit: 2023-01-31 | Discharge: 2023-01-31 | Disposition: A | Discharge status: Home / Self Care | Payer: Self-pay | Attending: Emergency Medicine | Admitting: Emergency Medicine

## 2023-01-31 ENCOUNTER — Other Ambulatory Visit: Payer: Self-pay

## 2023-01-31 DIAGNOSIS — R12 Heartburn: Secondary | ICD-10-CM | POA: Insufficient documentation

## 2023-01-31 DIAGNOSIS — R0789 Other chest pain: Secondary | ICD-10-CM | POA: Insufficient documentation

## 2023-01-31 DIAGNOSIS — F10920 Alcohol use, unspecified with intoxication, uncomplicated: Secondary | ICD-10-CM | POA: Insufficient documentation

## 2023-01-31 LAB — BASIC METABOLIC PANEL
Anion gap: 12 (ref 5–15)
BUN: 6 mg/dL (ref 6–20)
CO2: 27 mmol/L (ref 22–32)
Calcium: 9 mg/dL (ref 8.9–10.3)
Chloride: 100 mmol/L (ref 98–111)
Creatinine, Ser: 0.66 mg/dL (ref 0.61–1.24)
GFR, Estimated: >60 mL/min (ref >60)
Glucose, Bld: 94 mg/dL (ref 70–99)
Potassium: 4.2 mmol/L (ref 3.5–5.1)
Sodium: 139 mmol/L (ref 135–145)

## 2023-01-31 LAB — CBC WITH DIFFERENTIAL/PLATELET
Abs Immature Granulocytes: 0.01 10*3/uL (ref 0.00–0.07)
Basophils Absolute: 0.1 10*3/uL (ref 0.0–0.1)
Basophils Relative: 2 %
Eosinophils Absolute: 0.6 10*3/uL — ABNORMAL HIGH (ref 0.0–0.5)
Eosinophils Relative: 15 %
HCT: 44.3 % (ref 39.0–52.0)
Hemoglobin: 15.1 g/dL (ref 13.0–17.0)
Immature Granulocytes: 0 %
Lymphocytes Relative: 38 %
Lymphs Abs: 1.6 10*3/uL (ref 0.7–4.0)
MCH: 32 pg (ref 26.0–34.0)
MCHC: 34.1 g/dL (ref 30.0–36.0)
MCV: 93.9 fL (ref 80.0–100.0)
Monocytes Absolute: 0.3 10*3/uL (ref 0.1–1.0)
Monocytes Relative: 8 %
Neutro Abs: 1.5 10*3/uL — ABNORMAL LOW (ref 1.7–7.7)
Neutrophils Relative %: 37 %
Platelets: 182 10*3/uL (ref 150–400)
RBC: 4.72 MIL/uL (ref 4.22–5.81)
RDW: 12.8 % (ref 11.5–15.5)
WBC: 4.1 10*3/uL (ref 4.0–10.5)
nRBC: 0 % (ref 0.0–0.2)

## 2023-01-31 LAB — MAGNESIUM: Magnesium: 2 mg/dL (ref 1.7–2.4)

## 2023-01-31 LAB — TROPONIN I (HIGH SENSITIVITY)
Troponin I (High Sensitivity): 2 ng/L (ref <18)
Troponin I (High Sensitivity): 5 ng/L (ref <18)

## 2023-01-31 MED ORDER — THIAMINE MONONITRATE 100 MG PO TABS: 100.0000 mg | ORAL_TABLET | Freq: Every day | ORAL | Status: DC

## 2023-01-31 MED ORDER — LORAZEPAM 1 MG PO TABS: 0.0000 mg | ORAL_TABLET | Freq: Two times a day (BID) | ORAL | Status: DC

## 2023-01-31 MED ORDER — PANTOPRAZOLE SODIUM 40 MG IV SOLR
40.0000 mg | Freq: Once | INTRAVENOUS | Status: AC
  Administered 2023-01-31: 40 mg via INTRAVENOUS
  Filled 2023-01-31: qty 10

## 2023-01-31 MED ORDER — LORAZEPAM 2 MG/ML IJ SOLN: 0.0000 mg | Freq: Four times a day (QID) | INTRAMUSCULAR | Status: DC

## 2023-01-31 MED ORDER — LORAZEPAM 2 MG/ML IJ SOLN: 0.0000 mg | Freq: Two times a day (BID) | INTRAMUSCULAR | Status: DC

## 2023-01-31 MED ORDER — LORAZEPAM 1 MG PO TABS: 0.0000 mg | ORAL_TABLET | Freq: Four times a day (QID) | ORAL | Status: DC

## 2023-01-31 MED ORDER — THIAMINE HCL 100 MG/ML IJ SOLN
100.0000 mg | Freq: Every day | INTRAMUSCULAR | Status: DC
  Administered 2023-01-31: 100 mg via INTRAVENOUS
  Filled 2023-01-31: qty 2

## 2023-01-31 MED ORDER — LACTATED RINGERS IV BOLUS
1000.0000 mL | Freq: Once | INTRAVENOUS | Status: AC
  Administered 2023-01-31: 1000 mL via INTRAVENOUS

## 2023-01-31 MED ORDER — PANTOPRAZOLE SODIUM 40 MG PO TBEC: 40.0000 mg | DELAYED_RELEASE_TABLET | Freq: Every day | ORAL | 0 refills | Status: AC

## 2023-01-31 NOTE — ED Provider Notes (Signed)
[place] [facility]ERGENC[facility] DEPARTMENT AT [facility] Provider Note   CSN: [id number] Arrival date & time: 01/31/23  1621     History  Chief Complaint  Patient presents with   Alcohol Intoxication    Darren Day is a 57 y.o. male.  57 year old male presented for concern of alcohol intoxication as well as chest pain/acid reflux.  Denies history of CAD.  States since arriving to the emergency room he has remained asymptomatic.  He is eager for discharge.  No ride currently available for him.  The history is provided by the patient. A language interpreter was used.       Home Medications Prior to Admission medications   Medication Sig Start Date End Date Taking? Authorizing Provider  famotidine (PEPCID) 20 MG tablet Take 1 tablet (20 mg total) by mouth 2 (two) times daily. 06/25/19   Henderly, Britni A, PA-C  omeprazole (PRILOSEC) 20 MG capsule Take 1 capsule (20 mg total) by mouth daily. 04/02/18   Renne Crigler, PA-C  sucralfate (CARAFATE) 1 GM/10ML suspension Take 10 mLs (1 g total) by mouth 4 (four) times daily -  with meals and at bedtime. 06/25/19   Henderly, Britni A, PA-C      Allergies    Patient has no known allergies.    Review of Systems   Review of Systems  Constitutional:  Negative for chills and fever.  Cardiovascular:  Positive for chest pain. Negative for palpitations and leg swelling.  Gastrointestinal:  Negative for abdominal pain and nausea.  Neurological:  Negative for light-headedness and headaches.  All other systems reviewed and are negative.   Physical Exam Updated Vital Signs BP 117/87 (BP Location: Left Arm)   Pulse 63   Temp 97.9 F (36.6 C) (Oral)   Resp 16   SpO2 95%  Physical Exam Vitals and nursing note reviewed.  Constitutional:      General: He is not in acute distress.    Appearance: Normal appearance. He is not ill-appearing.  HENT:     Head: Normocephalic and atraumatic.     Nose: Nose normal.  Eyes:      Conjunctiva/sclera: Conjunctivae normal.  Cardiovascular:     Rate and Rhythm: Normal rate.  Pulmonary:     Effort: Pulmonary effort is normal. No respiratory distress.  Musculoskeletal:        General: No deformity. Normal range of motion.     Cervical back: Normal range of motion.  Skin:    Findings: No rash.  Neurological:     General: No focal deficit present.     Mental Status: He is alert and oriented to person, place, and time. Mental status is at baseline.     Cranial Nerves: No cranial nerve deficit.     Sensory: No sensory deficit.     Motor: No weakness.     ED Results / Procedures / Treatments   Labs (all labs ordered are listed, but only abnormal results are displayed) Labs Reviewed  CBC WITH DIFFERENTIAL/PLATELET - Abnormal; Notable for the following components:      Result Value   Neutro Abs 1.5 (*)    Eosinophils Absolute 0.6 (*)    All other components within normal limits  BASIC METABOLIC PANEL  MAGNESIUM  TROPONIN I (HIGH SENSITIVITY)  TROPONIN I (HIGH SENSITIVITY)    EKG None  Radiology DG Chest 1 View  Result Date: 01/31/2023 CLINICAL DATA:  Chest pain EXAM: CHEST  1 VIEW COMPARISON:  06/25/2019 FINDINGS: Poor inspiration. Cardiomegaly.  Pulmonary venous hypertension, possibly with early interstitial edema. No visible effusion. Hiatal hernia probably present. IMPRESSION: Poor inspiration. Cardiomegaly. Pulmonary venous hypertension, possibly with early interstitial edema. Electronically Signed   By: Paulina Fusi M.D.   On: 01/31/2023 18:09    Procedures Procedures    Medications Ordered in ED Medications  LORazepam (ATIVAN) injection 0-4 mg ( Intravenous Not Given 01/31/23 1750)    Or  LORazepam (ATIVAN) tablet 0-4 mg ( Oral See Alternative 01/31/23 1750)  LORazepam (ATIVAN) injection 0-4 mg (has no administration in time range)    Or  LORazepam (ATIVAN) tablet 0-4 mg (has no administration in time range)  thiamine (VITAMIN B1) tablet 100 mg (  Oral See Alternative 01/31/23 1800)    Or  thiamine (VITAMIN B1) injection 100 mg (100 mg Intravenous Given 01/31/23 1800)  lactated ringers bolus 1,000 mL (0 mLs Intravenous Stopped 01/31/23 1905)  pantoprazole (PROTONIX) injection 40 mg (40 mg Intravenous Given 01/31/23 1800)    ED Course/ Medical Decision Making/ A&P                                 Medical Decision Making Amount and/or Complexity of Data Reviewed Labs: ordered. Radiology: ordered.  Risk OTC drugs. Prescription drug management.   57 year old male presents today for concern of alcohol intoxication and chest pain.  He is unsure of the duration.  States he has history of acid reflux.  Symptoms have resolved since arriving to the emergency department.  Does not have right currently but would like to be discharged.  Will obtain labs, provide fluids, and placed on CIWA protocol and reevaluate.  According to family likely if report to EMS he has been binge drinking.  CBC unremarkable.  BMP unremarkable.  Magnesium 2.0.  Troponin negative x 2.  EKG without acute ischemic changes.  Chest x-ray without acute cardiopulmonary process.  Low suspicion for ACS.  Remains without symptoms.  He is appropriate for discharge.  Discharged in stable condition.  Return precautions discussed.  Final Clinical Impression(s) / ED Diagnoses Final diagnoses:  Alcoholic intoxication without complication (HCC)  Heartburn  Atypical chest pain    Rx / DC Orders ED Discharge Orders          Ordered    pantoprazole (PROTONIX) 40 MG tablet  Daily        01/31/23 2148              Marita Kansas, PA-C 01/31/23 2158    Rozelle Logan, DO 01/31/23 2338

## 2023-01-31 NOTE — ED Triage Notes (Addendum)
Pt arrives via EMS from private residence. Granddaughter called stating pt was non-compliant with medications. EMS states pt was stumbling upon arrival, pt binge drinking x3 days, due to life event. Pt with hx GERD. Pt also reports hx dysuria. Pt reports drinking beer for 3 days, denies heavy drinking prior to this weekend. Last drink at 1100 today. Pt reports main concern is heartburn, symptoms resolved per pt at this time. PT calm and cooperative.

## 2023-01-31 NOTE — Discharge Instructions (Signed)
Your workup today is reassuring.  Return for any concerning symptoms.  Protonix sent to the pharmacy for you for heartburn.

## 2023-05-31 ENCOUNTER — Ambulatory Visit: Payer: Self-pay | Admitting: Internal Medicine

## 2023-06-17 ENCOUNTER — Other Ambulatory Visit: Payer: Self-pay

## 2023-06-17 ENCOUNTER — Emergency Department (HOSPITAL_COMMUNITY)
Admission: EM | Admit: 2023-06-17 | Discharge: 2023-06-17 | Discharge status: Against Medical Advice | Payer: Self-pay | Attending: Emergency Medicine | Admitting: Emergency Medicine

## 2023-06-17 DIAGNOSIS — R3589 Other polyuria: Secondary | ICD-10-CM | POA: Insufficient documentation

## 2023-06-17 DIAGNOSIS — R42 Dizziness and giddiness: Secondary | ICD-10-CM | POA: Insufficient documentation

## 2023-06-17 DIAGNOSIS — R55 Syncope and collapse: Secondary | ICD-10-CM | POA: Insufficient documentation

## 2023-06-17 DIAGNOSIS — Z5321 Procedure and treatment not carried out due to patient leaving prior to being seen by health care provider: Secondary | ICD-10-CM | POA: Insufficient documentation

## 2023-06-17 LAB — URINALYSIS, ROUTINE W REFLEX MICROSCOPIC
Bilirubin Urine: NEGATIVE
Glucose, UA: NEGATIVE mg/dL
Hgb urine dipstick: NEGATIVE
Ketones, ur: NEGATIVE mg/dL
Leukocytes,Ua: NEGATIVE
Nitrite: NEGATIVE
Protein, ur: NEGATIVE mg/dL
Specific Gravity, Urine: 1.015 (ref 1.005–1.030)
pH: 7 (ref 5.0–8.0)

## 2023-06-17 LAB — BASIC METABOLIC PANEL
Anion gap: 10 (ref 5–15)
BUN: 13 mg/dL (ref 6–20)
CO2: 27 mmol/L (ref 22–32)
Calcium: 9.9 mg/dL (ref 8.9–10.3)
Chloride: 102 mmol/L (ref 98–111)
Creatinine, Ser: 0.74 mg/dL (ref 0.61–1.24)
GFR, Estimated: >60 mL/min (ref >60)
Glucose, Bld: 97 mg/dL (ref 70–99)
Potassium: 4.3 mmol/L (ref 3.5–5.1)
Sodium: 139 mmol/L (ref 135–145)

## 2023-06-17 LAB — CBC
HCT: 50.7 % (ref 39.0–52.0)
Hemoglobin: 16.7 g/dL (ref 13.0–17.0)
MCH: 32.3 pg (ref 26.0–34.0)
MCHC: 32.9 g/dL (ref 30.0–36.0)
MCV: 98.1 fL (ref 80.0–100.0)
Platelets: 138 10*3/uL — ABNORMAL LOW (ref 150–400)
RBC: 5.17 MIL/uL (ref 4.22–5.81)
RDW: 12.8 % (ref 11.5–15.5)
WBC: 4.7 10*3/uL (ref 4.0–10.5)
nRBC: 0 % (ref 0.0–0.2)

## 2023-06-17 NOTE — ED Triage Notes (Signed)
Pt/translator stated, This morning I was dizzy and felt like I was passing out. I took 4 Benadryl 4 yesterday 1600 due to allergy . I also had 4 more Benadryl at 2200 last night cause of allergy.  I've also being urinating every 30 min.

## 2023-06-25 ENCOUNTER — Other Ambulatory Visit: Payer: Self-pay

## 2023-06-25 ENCOUNTER — Emergency Department (HOSPITAL_COMMUNITY)
Admission: EM | Admit: 2023-06-25 | Discharge: 2023-06-25 | Disposition: A | Discharge status: Home / Self Care | Payer: Self-pay | Attending: Emergency Medicine | Admitting: Emergency Medicine

## 2023-06-25 ENCOUNTER — Emergency Department (HOSPITAL_COMMUNITY): Payer: Self-pay

## 2023-06-25 DIAGNOSIS — D696 Thrombocytopenia, unspecified: Secondary | ICD-10-CM | POA: Insufficient documentation

## 2023-06-25 DIAGNOSIS — R748 Abnormal levels of other serum enzymes: Secondary | ICD-10-CM

## 2023-06-25 DIAGNOSIS — Y908 Blood alcohol level of 240 mg/100 ml or more: Secondary | ICD-10-CM | POA: Insufficient documentation

## 2023-06-25 DIAGNOSIS — K2920 Alcoholic gastritis without bleeding: Secondary | ICD-10-CM | POA: Insufficient documentation

## 2023-06-25 DIAGNOSIS — R7401 Elevation of levels of liver transaminase levels: Secondary | ICD-10-CM | POA: Insufficient documentation

## 2023-06-25 DIAGNOSIS — F10920 Alcohol use, unspecified with intoxication, uncomplicated: Secondary | ICD-10-CM

## 2023-06-25 DIAGNOSIS — F10220 Alcohol dependence with intoxication, uncomplicated: Secondary | ICD-10-CM | POA: Insufficient documentation

## 2023-06-25 DIAGNOSIS — R112 Nausea with vomiting, unspecified: Secondary | ICD-10-CM

## 2023-06-25 LAB — COMPREHENSIVE METABOLIC PANEL
ALT: 135 U/L — ABNORMAL HIGH (ref 0–44)
AST: 82 U/L — ABNORMAL HIGH (ref 15–41)
Albumin: 4 g/dL (ref 3.5–5.0)
Alkaline Phosphatase: 79 U/L (ref 38–126)
Anion gap: 15 (ref 5–15)
BUN: 12 mg/dL (ref 6–20)
CO2: 24 mmol/L (ref 22–32)
Calcium: 9.1 mg/dL (ref 8.9–10.3)
Chloride: 95 mmol/L — ABNORMAL LOW (ref 98–111)
Creatinine, Ser: 0.63 mg/dL (ref 0.61–1.24)
GFR, Estimated: >60 mL/min (ref >60)
Glucose, Bld: 123 mg/dL — ABNORMAL HIGH (ref 70–99)
Potassium: 3.7 mmol/L (ref 3.5–5.1)
Sodium: 134 mmol/L — ABNORMAL LOW (ref 135–145)
Total Bilirubin: 0.7 mg/dL (ref 0.0–1.2)
Total Protein: 7.2 g/dL (ref 6.5–8.1)

## 2023-06-25 LAB — LIPASE, BLOOD: Lipase: 39 U/L (ref 11–51)

## 2023-06-25 LAB — CBC
HCT: 45.7 % (ref 39.0–52.0)
Hemoglobin: 16 g/dL (ref 13.0–17.0)
MCH: 32.6 pg (ref 26.0–34.0)
MCHC: 35 g/dL (ref 30.0–36.0)
MCV: 93.1 fL (ref 80.0–100.0)
Platelets: 147 10*3/uL — ABNORMAL LOW (ref 150–400)
RBC: 4.91 MIL/uL (ref 4.22–5.81)
RDW: 12.7 % (ref 11.5–15.5)
WBC: 5 10*3/uL (ref 4.0–10.5)
nRBC: 0 % (ref 0.0–0.2)

## 2023-06-25 LAB — ETHANOL: Alcohol, Ethyl (B): 330 mg/dL (ref <10)

## 2023-06-25 LAB — TROPONIN I (HIGH SENSITIVITY): Troponin I (High Sensitivity): 4 ng/L (ref <18)

## 2023-06-25 MED ORDER — FAMOTIDINE IN NACL 20-0.9 MG/50ML-% IV SOLN
20.0000 mg | Freq: Once | INTRAVENOUS | Status: AC
  Administered 2023-06-25: 20 mg via INTRAVENOUS
  Filled 2023-06-25: qty 50

## 2023-06-25 MED ORDER — PROCHLORPERAZINE EDISYLATE 10 MG/2ML IJ SOLN
5.0000 mg | Freq: Once | INTRAMUSCULAR | Status: AC
  Administered 2023-06-25: 5 mg via INTRAVENOUS
  Filled 2023-06-25: qty 2

## 2023-06-25 NOTE — Discharge Instructions (Signed)
 Evaluation was overall reassuring.  Recommend you follow-up with PCP as your liver enzymes were elevated.  Suspect it is related to your alcohol drinking.  If you develop abdominal pain, cannot tolerate fluid intake, develop a fever, have a seizure or any other concerning symptom please return for any department further evaluation.

## 2023-06-25 NOTE — ED Notes (Signed)
 CCMD called and verified that patient is visible and registered for central monitoring

## 2023-06-25 NOTE — ED Provider Notes (Addendum)
 Accepted handoff at shift change from [facility], PA-C. Please see prior provider note for more detail.   Briefly: Patient is 58 y.o. presenting for alcohol intoxication and vomiting.  DDX: concern for alcohol intoxication, dehydration, intra-abdominal infection, other  Plan: Observation and reassess patient with plans to allow alcohol to metabolize.  P.o. challenge.  If feeling better likely discharge with PCP follow-up   Physical Exam  BP 107/78   Pulse (!) 124   Temp 98.3 F (36.8 C) (Oral)   Resp (!) 21   Ht 5\' 5"  (1.651 m)   Wt 70 kg   SpO2 97%   BMI 25.68 kg/m   Physical Exam  Procedures  Procedures  ED Course / MDM   Clinical Course as of 06/25/23 0927  Fri Jun 25, 2023  0514 Alcohol, Ethyl (B)(!!): 330 [AH]  0514 Platelets(!): 147 [AH]  0514 AST(!): 82 [AH]  0514 ALT(!): 135 [AH]  0921 BP: 107/78 [JR]  0922 Pulse Rate(!): 124 [JR]  0922 Lipase: 39 [JR]    Clinical Course User Index [AH] Arthor Captain, PA-C [JR] Gareth Eagle, PA-C   Medical Decision Making Amount and/or Complexity of Data Reviewed Labs: ordered. Decision-making details documented in ED Course. Radiology: ordered.  Risk Prescription drug management.   On reassessment, patient was drinking water and there was no witnessed vomiting during encounter.  He stated that he was feeling much better and expressed a desire to go home.  Advised him to follow-up with his PCP for reevaluation and recheck of his labs his liver enzymes were elevated.  Discussed pertinent return precautions.  Discharged in good condition.  Sent Zofran to pharmacy.     Gareth Eagle, PA-C 06/25/23 0931    Coral Spikes, DO 06/25/23 [phone]

## 2023-06-25 NOTE — ED Notes (Signed)
 Pt ambulated independently to restroom. Crackers and water provided for PO challenge.

## 2023-06-25 NOTE — ED Provider Notes (Signed)
 [place] EMERGENCY DEPARTMENT AT [facility] Provider Note   CSN: [id number] Arrival date & time: 06/25/23  0358     History  Chief Complaint  Patient presents with   Emesis    Darren Day is a 58 y.o. male who is frequently seen in the emergency department for alcohol intoxication who arrives here with alcohol intoxication and vomiting.  EMS reports that the patient's family called out because he was vomiting and they thought he might be vomiting blood.  They threw away his vomitus before EMS arrived the patient has not vomited since.  Patient is clearly intoxicated and is therefore a poor historian.  Translation services are utilized.  He states that he started drinking beer and he started having burning pain and then began vomiting.  No other complaints at this time   Emesis      Home Medications Prior to Admission medications   Medication Sig Start Date End Date Taking? Authorizing Provider  famotidine (PEPCID) 20 MG tablet Take 1 tablet (20 mg total) by mouth 2 (two) times daily. 06/25/19   Henderly, Britni A, PA-C  omeprazole (PRILOSEC) 20 MG capsule Take 1 capsule (20 mg total) by mouth daily. 04/02/18   Renne Crigler, PA-C  pantoprazole (PROTONIX) 40 MG tablet Take 1 tablet (40 mg total) by mouth daily. 01/31/23   Karie Mainland, Amjad, PA-C  sucralfate (CARAFATE) 1 GM/10ML suspension Take 10 mLs (1 g total) by mouth 4 (four) times daily -  with meals and at bedtime. 06/25/19   Henderly, Britni A, PA-C      Allergies    Patient has no known allergies.    Review of Systems   Review of Systems  Gastrointestinal:  Positive for vomiting.    Physical Exam Updated Vital Signs BP 106/71 (BP Location: Right Arm)   Pulse 90   Temp 98.8 F (37.1 C) (Oral)   Resp 17   Ht 5\' 5"  (1.651 m)   Wt 70 kg   SpO2 93%   BMI 25.68 kg/m  Physical Exam Vitals and nursing note reviewed.  Constitutional:      General: He is not in acute distress.    Appearance:  He is well-developed. He is not diaphoretic.     Comments: Smells strongly of alcohol  HENT:     Head: Normocephalic and atraumatic.  Eyes:     General: No scleral icterus.    Conjunctiva/sclera: Conjunctivae normal.  Cardiovascular:     Rate and Rhythm: Normal rate and regular rhythm.     Heart sounds: Normal heart sounds.  Pulmonary:     Effort: Pulmonary effort is normal. No respiratory distress.     Breath sounds: Normal breath sounds.  Abdominal:     Palpations: Abdomen is soft.     Tenderness: There is no abdominal tenderness.  Musculoskeletal:     Cervical back: Normal range of motion and neck supple.  Skin:    General: Skin is warm and dry.  Neurological:     General: No focal deficit present.     Mental Status: He is alert.     Comments: Speech is slurred  Psychiatric:     Comments: Appears to be intoxicated on alcohol.     ED Results / Procedures / Treatments   Labs (all labs ordered are listed, but only abnormal results are displayed) Labs Reviewed  ETHANOL - Abnormal; Notable for the following components:      Result Value   Alcohol, Ethyl (B) 330 (*)  All other components within normal limits  CBC - Abnormal; Notable for the following components:   Platelets 147 (*)    All other components within normal limits  COMPREHENSIVE METABOLIC PANEL - Abnormal; Notable for the following components:   Sodium 134 (*)    Chloride 95 (*)    Glucose, Bld 123 (*)    AST 82 (*)    ALT 135 (*)    All other components within normal limits  LIPASE, BLOOD  TROPONIN I (HIGH SENSITIVITY)    EKG EKG Interpretation Date/Time:  Friday June 25 2023 04:08:30 EST Ventricular Rate:  76 PR Interval:  156 QRS Duration:  108 QT Interval:  409 QTC Calculation: 460 R Axis:   -13  Text Interpretation: Sinus rhythm RSR' in V1 or V2, right VCD or RVH No significant change was found Confirmed by Glynn Octave [phone]) on 06/25/2023 4:16:09 AM  Radiology DG Chest Port 1  View Result Date: 06/25/2023 CLINICAL DATA:  Epigastric pain EXAM: PORTABLE CHEST 1 VIEW COMPARISON:  01/31/2023 FINDINGS: Low volume film. The cardio pericardial silhouette is enlarged. There is pulmonary vascular congestion without overt pulmonary edema. The lungs are clear without focal pneumonia, edema, pneumothorax or pleural effusion. No acute bony abnormality. Telemetry leads overlie the chest. IMPRESSION: Low volume film with pulmonary vascular congestion. Electronically Signed   By: Kennith Center M.D.   On: 06/25/2023 07:27    Procedures Procedures    Medications Ordered in ED Medications  famotidine (PEPCID) IVPB 20 mg premix (0 mg Intravenous Stopped 06/25/23 0528)  prochlorperazine (COMPAZINE) injection 5 mg (5 mg Intravenous Given 06/25/23 0417)    ED Course/ Medical Decision Making/ A&P Clinical Course as of 06/25/23 1614  Fri Jun 25, 2023  0514 Alcohol, Ethyl (B)(!!): 330 [AH]  0514 Platelets(!): 147 [AH]  0514 AST(!): 82 [AH]  0514 ALT(!): 135 [AH]  0921 BP: 107/78 [JR]  0922 Pulse Rate(!): 124 [JR]  0922 Lipase: 39 [JR]    Clinical Course User Index [AH] Arthor Captain, PA-C [JR] Gareth Eagle, PA-C                                 Medical Decision Making Amount and/or Complexity of Data Reviewed Labs: ordered. Decision-making details documented in ED Course. Radiology: ordered.  Risk Prescription drug management.   This patient presents to the ED for concern of ETOH intoxication and vomiting. The emergent differential diagnosis for vomiting includes, but is not limited to ACS/MI, DKA, Ischemic bowel, Meningitis, Sepsis, Acute gastric dilation, Adrenal insufficiency, Appendicitis,  Bowel obstruction/ileus, Carbon monoxide poisoning, Cholecystitis, Electrolyte abnormalities, Elevated ICP, Gastric outlet obstruction, Pancreatitis, Ruptured viscus, Biliary colic, Cannabinoid hyperemesis syndrome, Gastritis, Gastroenteritis, Gastroparesis,  Narcotic withdrawal,  Peptic ulcer disease, and UTI     Additional history obtained:  Additional history obtained from EMR / EMS at bedside Professional translation services utilized    Lab Tests:  I Ordered, and personally interpreted labs.  The pertinent results include:   ETOH 330 CBC No anemia, Mild thrombocytopenia- likely due to marrow suppression from ETOH- follow trends with PCP CMP- ALT 135, AST 82   ALT: AST  > 2:1 suggestive of chronic alcoholism Lipase wnl       Imaging Studies ordered:  I ordered imaging studies including cxr  I independently visualized and interpreted imaging which showed no acute findings I agree with the radiologist interpretation   Medicines ordered and prescription drug management:  I  ordered medication including Pepcid/ compazine  for burning epigastric pain and vomiting  Reevaluation of the patient after these medicines showed that the patient improved I have reviewed the patients home medicines and have made adjustments as needed   Problem List / ED Course:  Alcoholic intoxication without complication (HCC)  Acute alcoholic gastritis, presence of bleeding unspecified  Nausea and vomiting, unspecified vomiting type  Thrombocytopenia (HCC)  Elevated liver enzymes  EKG NSR rate of 76 - normal ecg  I considered ACS as cause, howver I have extremely lpw clinical suspicion for acs as patient is clinically intoxicated with burning epigastric pain and vomiting after drinking enough beer to reach  a blood alcohol or 339 and has recurrent visits for the same   Social Determinants of Health:  Language Barrier    Patient with vomiting and etoh intoxication. Currently still very intoxicated WIll need to metabolize ETOH enough for clinical sobriety Sign out given to PA Robinson at shift handoff           Final Clinical Impression(s) / ED Diagnoses Final diagnoses:  Alcoholic intoxication without complication (HCC)  Acute alcoholic  gastritis, presence of bleeding unspecified  Nausea and vomiting, unspecified vomiting type  Thrombocytopenia (HCC)  Elevated liver enzymes    Rx / DC Orders ED Discharge Orders     None         Arthor Captain, PA-C 06/25/23 1614    Glynn Octave, MD 06/25/23 1814

## 2023-06-25 NOTE — ED Triage Notes (Signed)
 Presents via EMS for burning epigastric pain, N/V x 1 hr, family states blood in emesis, not seen by EMS. States not a daily drinker, endorses 1 beer today EMS VS: 98%RA, 148CBG, RR20, 150/96  EtOH odor noted by EMS and in triage room Arrives with 18G R AC from EMS. No meds en route

## 2023-11-02 DIAGNOSIS — E785 Hyperlipidemia, unspecified: Secondary | ICD-10-CM | POA: Insufficient documentation

## 2023-11-10 ENCOUNTER — Ambulatory Visit: Payer: Self-pay | Admitting: Internal Medicine

## 2024-03-22 ENCOUNTER — Encounter: Payer: Self-pay | Admitting: Internal Medicine

## 2024-03-22 ENCOUNTER — Ambulatory Visit (INDEPENDENT_AMBULATORY_CARE_PROVIDER_SITE_OTHER): Payer: Self-pay | Admitting: Internal Medicine

## 2024-03-22 VITALS — BP 120/70 | HR 73 | Resp 13 | Ht 63.0 in | Wt 151.0 lb

## 2024-03-22 DIAGNOSIS — R300 Dysuria: Secondary | ICD-10-CM

## 2024-03-22 DIAGNOSIS — R350 Frequency of micturition: Secondary | ICD-10-CM

## 2024-03-22 DIAGNOSIS — E782 Mixed hyperlipidemia: Secondary | ICD-10-CM

## 2024-03-22 DIAGNOSIS — K4090 Unilateral inguinal hernia, without obstruction or gangrene, not specified as recurrent: Secondary | ICD-10-CM

## 2024-03-22 DIAGNOSIS — K4030 Unilateral inguinal hernia, with obstruction, without gangrene, not specified as recurrent: Secondary | ICD-10-CM

## 2024-03-22 LAB — POCT URINALYSIS DIPSTICK
Bilirubin, UA: NEGATIVE
Blood, UA: NEGATIVE
Glucose, UA: NEGATIVE
Ketones, UA: NEGATIVE
Leukocytes, UA: NEGATIVE
Nitrite, UA: NEGATIVE
Protein, UA: POSITIVE — AB
Spec Grav, UA: 1.015 (ref 1.010–1.025)
Urobilinogen, UA: 0.2 U/dL
pH, UA: 7.5 (ref 5.0–8.0)

## 2024-03-22 NOTE — Progress Notes (Addendum)
 "   Subjective:    Patient ID: Darren Day, male   DOB: [birth year], 58 y.o.   MRN: [id number]   HPI  [place], interpreting  50090  Here to establish  Burning on urination for 2 years:  Has been to 2 doctors and given medicine that does not help.  Burns in penile urethra when urinating.   Also describes urinary frequency during daytime only--every 40 to 60 minutes. Does not have frequency at night--generally not even once during night.. Drinks a lot of water during day. Urine flow is good No urine odor. No fever.   No flank pain or hematuria.   No penile discharge.   He is not sexually active and has not had relations for over a year. He was sexually active at time of start of symptoms.   He states with a long term male partner.   He is not aware of his partner having any vaginal pain, discharge or itching at the time.   He does state about 6 months ago, he had clear discharge and itching about what sounds like his glans penis, underneath the foreskin.  He took 1 Amoxicillin cap daily for 3 days and resolved First evaluation last year:  [facility].  Was told he had a urine infection and also his prostate was infected and treated with antibiotics:  cipro 500 mg twice daily for 30 days with mild improvement   He took the Tamsulosin for a total of 5 months without improvement. He did not return for follow up Second 4 months ago:  Patient shows me a bottle of Atorvastatin 20 mg which he is taking 1 tab twice daily.  Dr. Aloysius Rummer in [place].  Denies any other treatment Denies any history of diabetes.     Current Meds  Medication Sig   atorvastatin (LIPITOR) 20 MG tablet Take 20 mg by mouth 2 times daily at 12 noon and 4 pm.   No Known Allergies  Past Medical History:  Diagnosis Date   Hyperlipidemia 11/2023   another office in [place]   Inguinal hernia of right side without obstruction or gangrene 2017   History reviewed. No pertinent  surgical history.  Family History  Problem Relation Age of Onset   Arthritis Mother    Other Father        vision       Review of Systems    Objective:   BP 120/70 (BP Location: Left Arm, Patient Position: Sitting, Cuff Size: Normal)   Pulse 73   Resp 13   Ht 5' 3 (1.6 m)   Wt 151 lb (68.5 kg)   BMI 26.75 kg/m   Physical Exam HENT:     Head: Normocephalic and atraumatic.     Right Ear: Tympanic membrane, ear canal and external ear normal.     Left Ear: Tympanic membrane, ear canal and external ear normal.     Mouth/Throat:     Mouth: Mucous membranes are moist.     Pharynx: Oropharynx is clear.  Eyes:     Extraocular Movements: Extraocular movements intact.     Conjunctiva/sclera: Conjunctivae normal.     Pupils: Pupils are equal, round, and reactive to light.  Neck:     Thyroid: No thyroid mass or thyromegaly.  Cardiovascular:     Rate and Rhythm: Normal rate and regular rhythm.     Heart sounds: S1 normal and S2 normal. No murmur heard.    No friction rub. No S3 or S4  sounds.  Pulmonary:     Effort: Pulmonary effort is normal.     Breath sounds: Normal breath sounds and air entry.  Abdominal:     General: Bowel sounds are normal.     Palpations: Abdomen is soft. There is no hepatomegaly, splenomegaly or mass.     Hernia: No hernia is present.     Comments: No flank tenderness.  Mild bilateral lower quadrant and suprapubic tenderness without rebound or peritoneal signs.    Genitourinary:    Pubic Area: No rash.      Penis: Normal and uncircumcised. No erythema, tenderness, discharge, swelling or lesions.      Testes:        Right: Mass, tenderness or swelling not present. Right testis is descended.        Left: Mass, tenderness or swelling not present.     Prostate: Tender (Mildly so.  Smooth and not boggy, however.). Not enlarged.       Comments: Large hernia in right inguinal area.  No overlying discoloration.  Unable to reduce even when lying in  recumbent position.  Tender with palpation/attempts at reduction. Musculoskeletal:        General: Normal range of motion.     Cervical back: Normal range of motion and neck supple.     Right lower leg: No edema.     Left lower leg: No edema.  Lymphadenopathy:     Head:     Right side of head: No submental or submandibular adenopathy.     Left side of head: No submental or submandibular adenopathy.     Cervical: No cervical adenopathy.  Skin:    General: Skin is warm.     Findings: No rash.  Neurological:     Mental Status: He is alert.      Assessment & Plan   Chronic daytime only dysuria and urinary frequency:  Not clear this is infection, urinary or prostatic.  UA unremarkable. Not clear if may be related to large right inguinal hernia.  Sending for urine culture, penile swab for GC/chlamydia.  He will return for RPR, HIV on Friday with fasting labs.  If able to get into Urology for hernia repair and labs not supportive of etiology, can get help with this chronic issue.  2.  Right incarcerated Inguinal hernia:  He needs an urgent application for [facility] orange card and referral to urology or Gen Surgery for repair with his recent history of significant pain in right groin.  Again, consider this as part of his urinary symptoms.    3.  Apparent hyperlipidemia:  patient not aware of his diagnosis, though on Atorvastatin.  Will send for records from other two offices.  Return for FLP, A1C, CMP, CBC in 2 days.  Need to see if responding to statin, though suspect not taking regularly.     "

## 2024-03-23 DIAGNOSIS — K4030 Unilateral inguinal hernia, with obstruction, without gangrene, not specified as recurrent: Secondary | ICD-10-CM | POA: Insufficient documentation

## 2024-03-23 DIAGNOSIS — R300 Dysuria: Secondary | ICD-10-CM | POA: Insufficient documentation

## 2024-03-23 DIAGNOSIS — R350 Frequency of micturition: Secondary | ICD-10-CM | POA: Insufficient documentation

## 2024-03-24 ENCOUNTER — Other Ambulatory Visit (INDEPENDENT_AMBULATORY_CARE_PROVIDER_SITE_OTHER): Payer: Self-pay

## 2024-03-24 ENCOUNTER — Telehealth: Payer: Self-pay | Admitting: Internal Medicine

## 2024-03-24 DIAGNOSIS — E782 Mixed hyperlipidemia: Secondary | ICD-10-CM

## 2024-03-24 DIAGNOSIS — Z114 Encounter for screening for human immunodeficiency virus [HIV]: Secondary | ICD-10-CM

## 2024-03-24 DIAGNOSIS — Z7689 Persons encountering health services in other specified circumstances: Secondary | ICD-10-CM

## 2024-03-24 LAB — URINE CULTURE: Organism ID, Bacteria: NO GROWTH

## 2024-03-24 NOTE — Telephone Encounter (Signed)
 Per Dr. Adella called and  spoke with [facility] from [facility]  03/23/24.  Notified Silvano there is a urgent referral for patient and Dr. Adella would like to know how soon can patient get scheduled with Urology or General Surgery?  Holly states [facility] does not have more appointments available for general surgery for the rest of the year.  States can send patient to urology does not know how soon he can get scheduled she well have to send them the referral and see. Notified Holly patient will be sending orange card application and asked if it could please be expedited.   Also, called patient and asked if he could send the application soon , so he can get scheduled with specialist.  Patient agree and states he will send application.

## 2024-03-24 NOTE — Telephone Encounter (Signed)
 Noted and thanks.

## 2024-03-25 LAB — PSA: Prostate Specific Ag, Serum: 0.1 ng/mL (ref 0.0–4.0)

## 2024-03-25 LAB — LIPID PANEL
Chol/HDL Ratio: 2.9 ratio (ref 0.0–5.0)
Cholesterol, Total: 157 mg/dL (ref 100–199)
HDL: 54 mg/dL (ref >39)
LDL Chol Calc (NIH): 89 mg/dL (ref 0–99)
Triglycerides: 72 mg/dL (ref 0–149)
VLDL Cholesterol Cal: 14 mg/dL (ref 5–40)

## 2024-03-25 LAB — COMPREHENSIVE METABOLIC PANEL WITH GFR
ALT: 111 IU/L — ABNORMAL HIGH (ref 0–44)
AST: 99 IU/L — ABNORMAL HIGH (ref 0–40)
Albumin: 4.4 g/dL (ref 3.8–4.9)
Alkaline Phosphatase: 123 IU/L (ref 47–123)
BUN/Creatinine Ratio: 17 (ref 9–20)
BUN: 12 mg/dL (ref 6–24)
Bilirubin Total: 0.9 mg/dL (ref 0.0–1.2)
CO2: 25 mmol/L (ref 20–29)
Calcium: 9.6 mg/dL (ref 8.7–10.2)
Chloride: 102 mmol/L (ref 96–106)
Creatinine, Ser: 0.69 mg/dL — ABNORMAL LOW (ref 0.76–1.27)
Globulin, Total: 2.9 g/dL (ref 1.5–4.5)
Glucose: 103 mg/dL — ABNORMAL HIGH (ref 70–99)
Potassium: 4.3 mmol/L (ref 3.5–5.2)
Sodium: 141 mmol/L (ref 134–144)
Total Protein: 7.3 g/dL (ref 6.0–8.5)
eGFR: 107 mL/min/1.73 (ref >59)

## 2024-03-25 LAB — CBC WITH DIFFERENTIAL/PLATELET
Basophils Absolute: 0.1 x10E3/uL (ref 0.0–0.2)
Basos: 2 %
EOS (ABSOLUTE): 0.4 x10E3/uL (ref 0.0–0.4)
Eos: 9 %
Hematocrit: 46.6 % (ref 37.5–51.0)
Hemoglobin: 15.2 g/dL (ref 13.0–17.7)
Immature Grans (Abs): 0 x10E3/uL (ref 0.0–0.1)
Immature Granulocytes: 0 %
Lymphocytes Absolute: 1.5 x10E3/uL (ref 0.7–3.1)
Lymphs: 33 %
MCH: 32.8 pg (ref 26.6–33.0)
MCHC: 32.6 g/dL (ref 31.5–35.7)
MCV: 100 fL — ABNORMAL HIGH (ref 79–97)
Monocytes Absolute: 0.4 x10E3/uL (ref 0.1–0.9)
Monocytes: 9 %
Neutrophils Absolute: 2.2 x10E3/uL (ref 1.4–7.0)
Neutrophils: 47 %
Platelets: 162 x10E3/uL (ref 150–450)
RBC: 4.64 x10E6/uL (ref 4.14–5.80)
RDW: 12.8 % (ref 11.6–15.4)
WBC: 4.6 x10E3/uL (ref 3.4–10.8)

## 2024-03-25 LAB — HIV ANTIBODY (ROUTINE TESTING W REFLEX): HIV Screen 4th Generation wRfx: NONREACTIVE

## 2024-03-25 LAB — SYPHILIS: RPR W/REFLEX TO RPR TITER AND TREPONEMAL ANTIBODIES, TRADITIONAL SCREENING AND DIAGNOSIS ALGORITHM: RPR Ser Ql: NONREACTIVE

## 2024-03-25 LAB — HEMOGLOBIN A1C
Est. average glucose Bld gHb Est-mCnc: 111 mg/dL
Hgb A1c MFr Bld: 5.5 % (ref 4.8–5.6)

## 2024-03-26 ENCOUNTER — Ambulatory Visit: Payer: Self-pay | Admitting: Internal Medicine

## 2024-03-26 LAB — GC/CHLAMYDIA PROBE AMP
Chlamydia trachomatis, NAA: NEGATIVE
Neisseria Gonorrhoeae by PCR: NEGATIVE

## 2024-03-29 NOTE — Progress Notes (Signed)
 The patient is notified.

## 2024-05-30 ENCOUNTER — Ambulatory Visit: Payer: Self-pay | Admitting: Internal Medicine

## 2024-06-01 ENCOUNTER — Telehealth: Payer: Self-pay | Admitting: Internal Medicine

## 2024-06-01 DIAGNOSIS — F810 Specific reading disorder: Secondary | ICD-10-CM

## 2024-06-01 DIAGNOSIS — K4090 Unilateral inguinal hernia, without obstruction or gangrene, not specified as recurrent: Secondary | ICD-10-CM

## 2024-08-29 ENCOUNTER — Encounter: Payer: Self-pay | Admitting: Internal Medicine

## 2024-10-19 ENCOUNTER — Encounter: Payer: Self-pay | Admitting: Internal Medicine
